# Patient Record
Sex: Female | Born: 1955 | Race: White | Hispanic: No | Marital: Married | Smoking: Current every day smoker
Health system: Southern US, Community
[De-identification: ages and names within clinical notes are randomized; demographics above are authoritative.]

## PROBLEM LIST (undated history)

## (undated) DIAGNOSIS — Z720 Tobacco use: Secondary | ICD-10-CM

## (undated) DIAGNOSIS — R55 Syncope and collapse: Secondary | ICD-10-CM

## (undated) DIAGNOSIS — E7800 Pure hypercholesterolemia, unspecified: Secondary | ICD-10-CM

## (undated) DIAGNOSIS — E782 Mixed hyperlipidemia: Secondary | ICD-10-CM

## (undated) DIAGNOSIS — T7840XA Allergy, unspecified, initial encounter: Secondary | ICD-10-CM

## (undated) DIAGNOSIS — E041 Nontoxic single thyroid nodule: Secondary | ICD-10-CM

## (undated) DIAGNOSIS — M1990 Unspecified osteoarthritis, unspecified site: Secondary | ICD-10-CM

## (undated) DIAGNOSIS — I10 Essential (primary) hypertension: Secondary | ICD-10-CM

## (undated) DIAGNOSIS — N390 Urinary tract infection, site not specified: Secondary | ICD-10-CM

## (undated) DIAGNOSIS — F32A Depression, unspecified: Secondary | ICD-10-CM

## (undated) DIAGNOSIS — E785 Hyperlipidemia, unspecified: Secondary | ICD-10-CM

## (undated) DIAGNOSIS — Z780 Asymptomatic menopausal state: Secondary | ICD-10-CM

## (undated) HISTORY — DX: Tobacco use: Z72.0

## (undated) HISTORY — DX: Nontoxic single thyroid nodule: E04.1

## (undated) HISTORY — DX: Mixed hyperlipidemia: E78.2

## (undated) HISTORY — DX: Pure hypercholesterolemia, unspecified: E78.00

## (undated) HISTORY — DX: Essential (primary) hypertension: I10

## (undated) HISTORY — DX: Syncope and collapse: R55

## (undated) HISTORY — DX: Unspecified osteoarthritis, unspecified site: M19.90

## (undated) HISTORY — DX: Asymptomatic menopausal state: Z78.0

## (undated) HISTORY — DX: Hyperlipidemia, unspecified: E78.5

## (undated) HISTORY — DX: Depression, unspecified: F32.A

## (undated) HISTORY — DX: Urinary tract infection, site not specified: N39.0

## (undated) HISTORY — DX: Allergy, unspecified, initial encounter: T78.40XA

---

## 1998-09-05 ENCOUNTER — Other Ambulatory Visit: Admission: RE | Admit: 1998-09-05 | Discharge: 1998-09-05 | Payer: Self-pay | Admitting: Gynecology

## 2000-09-01 ENCOUNTER — Other Ambulatory Visit: Admission: RE | Admit: 2000-09-01 | Discharge: 2000-09-01 | Payer: Self-pay | Admitting: Gynecology

## 2004-06-07 ENCOUNTER — Other Ambulatory Visit: Admission: RE | Admit: 2004-06-07 | Discharge: 2004-06-07 | Payer: Self-pay | Admitting: Gynecology

## 2005-06-18 ENCOUNTER — Other Ambulatory Visit: Admission: RE | Admit: 2005-06-18 | Discharge: 2005-06-18 | Payer: Self-pay | Admitting: Gynecology

## 2005-07-16 ENCOUNTER — Ambulatory Visit (HOSPITAL_COMMUNITY): Admission: RE | Admit: 2005-07-16 | Discharge: 2005-07-16 | Payer: Self-pay | Admitting: Gynecology

## 2006-07-08 ENCOUNTER — Other Ambulatory Visit: Admission: RE | Admit: 2006-07-08 | Discharge: 2006-07-08 | Payer: Self-pay | Admitting: Gynecology

## 2006-07-10 ENCOUNTER — Ambulatory Visit (HOSPITAL_COMMUNITY): Admission: RE | Admit: 2006-07-10 | Discharge: 2006-07-10 | Payer: Self-pay | Admitting: Gynecology

## 2006-07-13 ENCOUNTER — Emergency Department (HOSPITAL_COMMUNITY): Admission: EM | Admit: 2006-07-13 | Discharge: 2006-07-13 | Payer: Self-pay | Admitting: Emergency Medicine

## 2008-02-29 ENCOUNTER — Encounter: Admission: RE | Admit: 2008-02-29 | Discharge: 2008-02-29 | Payer: Self-pay | Admitting: Endocrinology

## 2008-12-08 LAB — HM COLONOSCOPY

## 2009-02-15 ENCOUNTER — Encounter: Admission: RE | Admit: 2009-02-15 | Discharge: 2009-02-15 | Payer: Self-pay | Admitting: Endocrinology

## 2010-03-14 ENCOUNTER — Encounter
Admission: RE | Admit: 2010-03-14 | Discharge: 2010-03-14 | Payer: Self-pay | Source: Home / Self Care | Attending: Endocrinology | Admitting: Endocrinology

## 2010-03-27 ENCOUNTER — Encounter
Admission: RE | Admit: 2010-03-27 | Discharge: 2010-03-27 | Payer: Self-pay | Source: Home / Self Care | Attending: Endocrinology | Admitting: Endocrinology

## 2010-04-08 ENCOUNTER — Encounter: Payer: Self-pay | Admitting: Endocrinology

## 2010-04-08 ENCOUNTER — Encounter: Payer: Self-pay | Admitting: Gynecology

## 2011-11-26 ENCOUNTER — Other Ambulatory Visit: Payer: Self-pay | Admitting: Endocrinology

## 2011-11-26 DIAGNOSIS — E041 Nontoxic single thyroid nodule: Secondary | ICD-10-CM

## 2011-12-11 ENCOUNTER — Ambulatory Visit
Admission: RE | Admit: 2011-12-11 | Discharge: 2011-12-11 | Disposition: A | Discharge status: Home / Self Care | Payer: BC Managed Care – PPO | Source: Ambulatory Visit | Attending: Endocrinology | Admitting: Endocrinology

## 2011-12-11 DIAGNOSIS — E041 Nontoxic single thyroid nodule: Secondary | ICD-10-CM

## 2012-12-15 IMAGING — US US SOFT TISSUE HEAD/NECK
1 series · 14 of 25 positions shown · non-contrast
Comparison: 03/14/2010

CLINICAL DATA: Thyroid nodules

THYROID ULTRASOUND
TECHNIQUE: Ultrasound examination of the thyroid gland and adjacent
soft tissues was performed.

[Series 1: us soft tissue head/neck · 0.08mm/px · 14 of 45 slices shown]
[im 1/45]
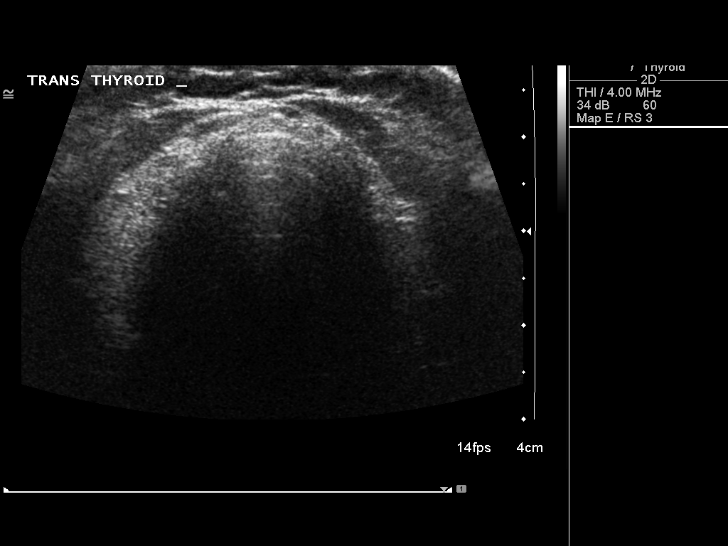
[im 4/45]
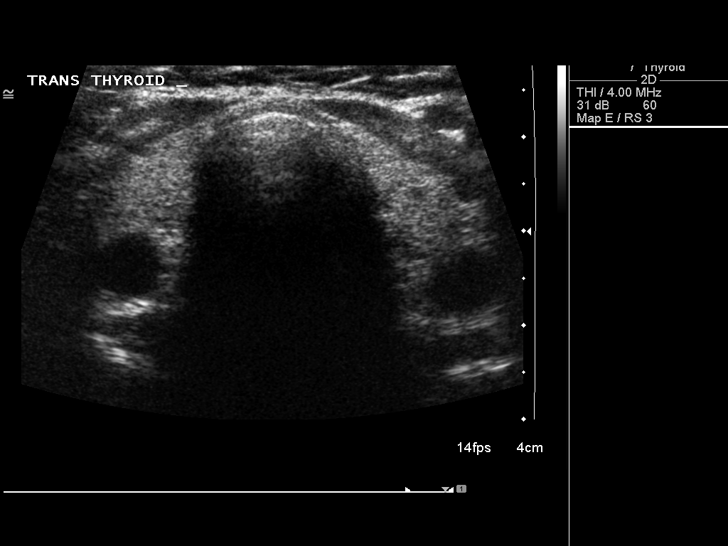
[im 8/45]
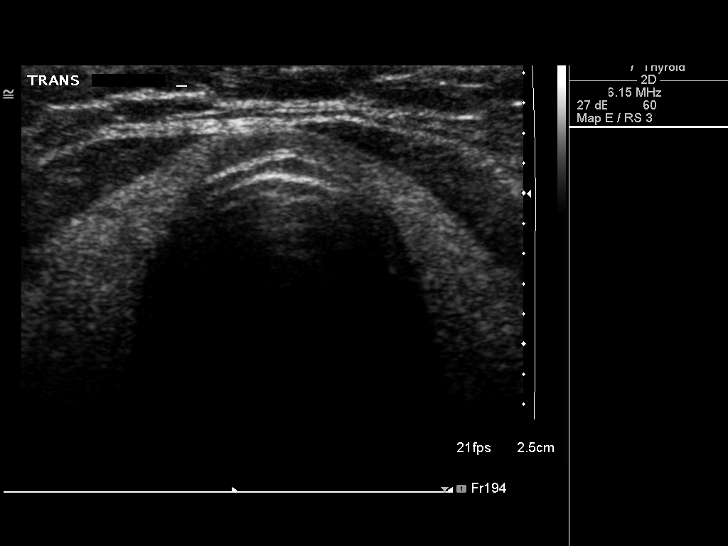
[im 12/45]
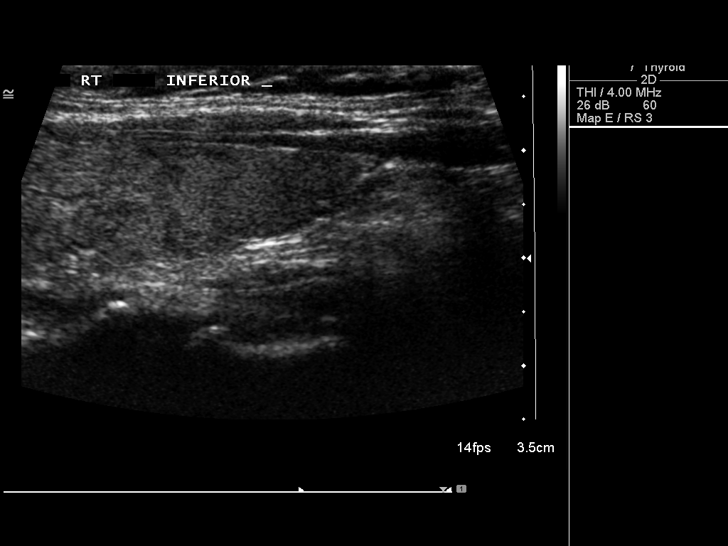
[im 15/45]
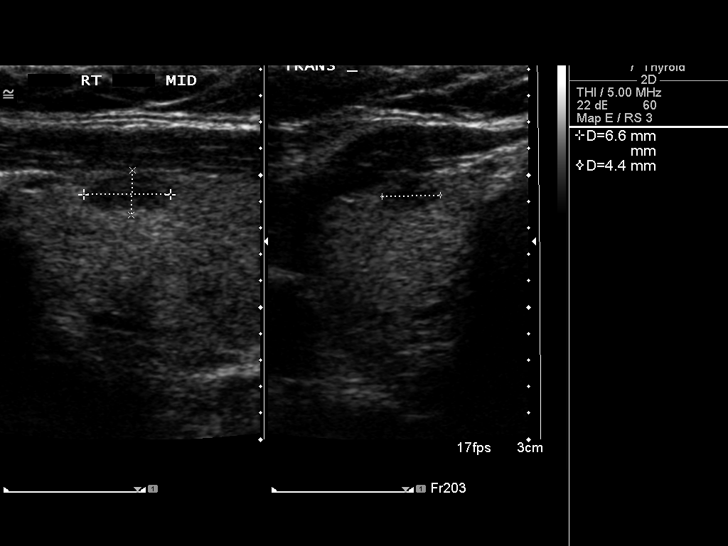
[im 17/45]
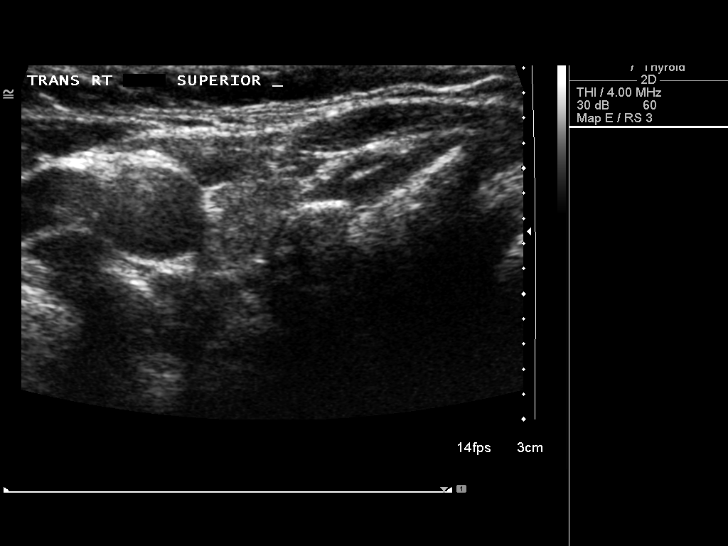
[im 21/45]
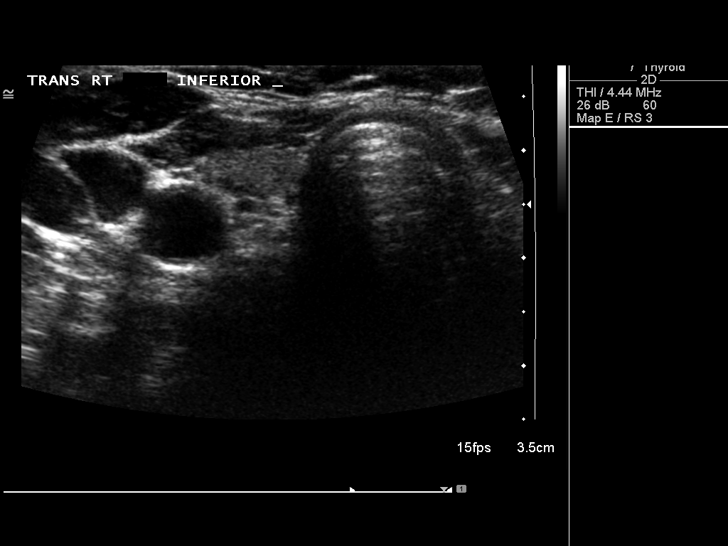
[im 24/45]
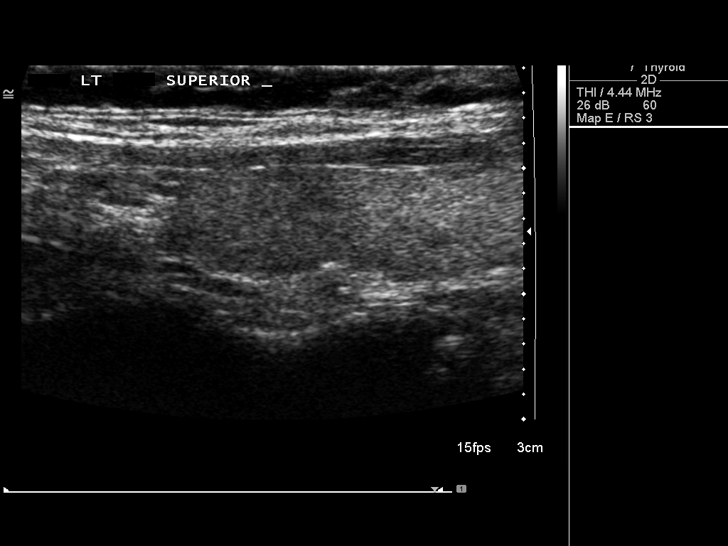
[im 28/45]
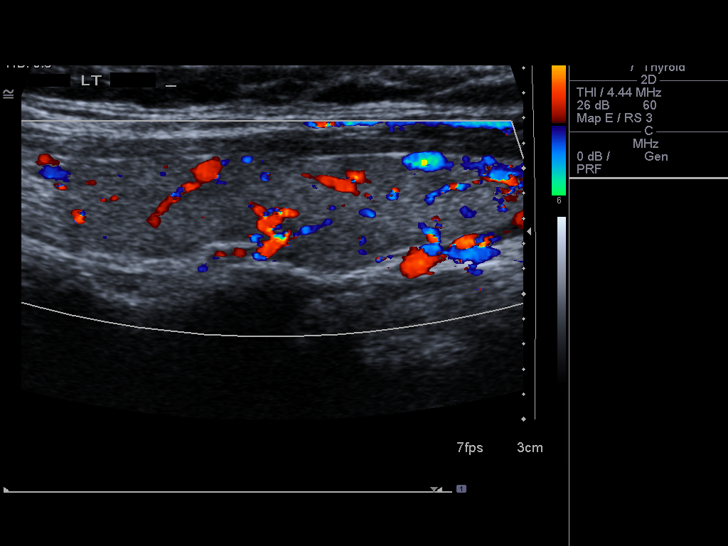
[im 30/45]
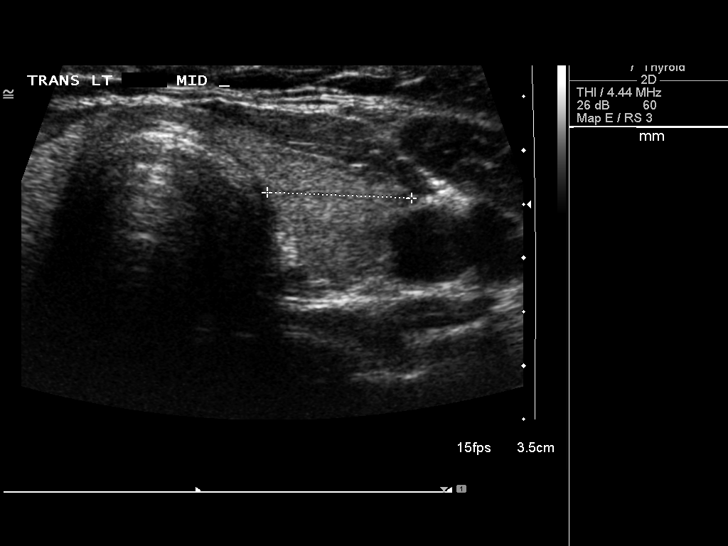
[im 34/45]
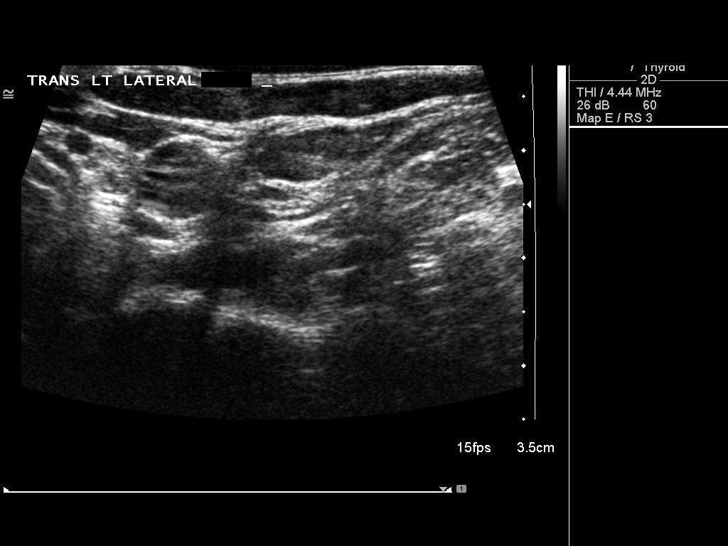
[im 37/45]
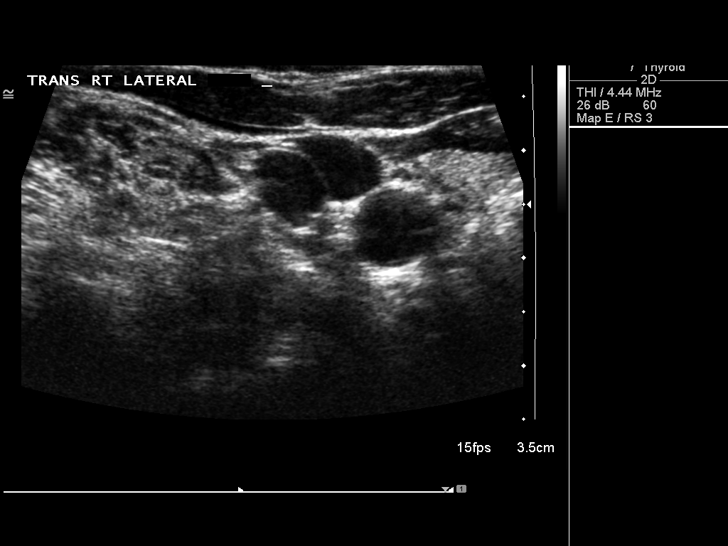
[im 41/45]
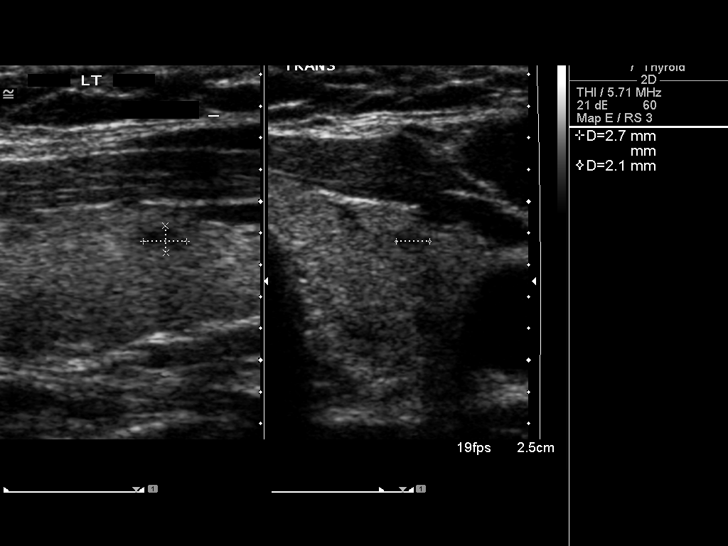
[im 45/45]
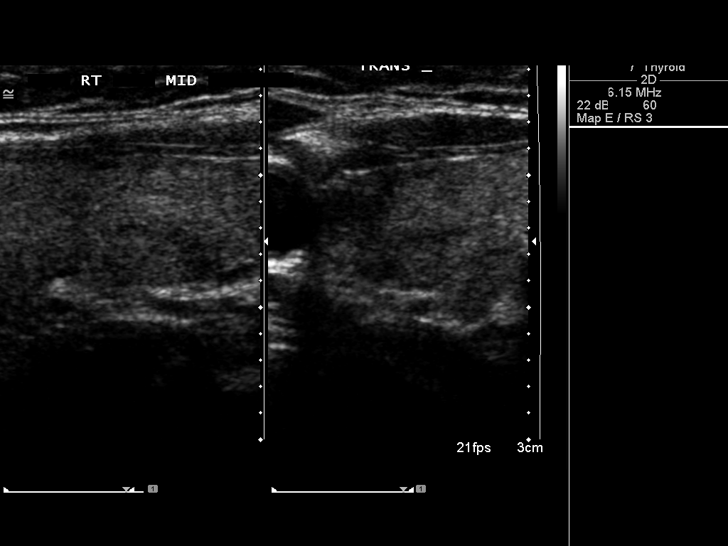

[14 of 25 positions shown; findings below may reference images not displayed]

FINDINGS: Right thyroid lobe:  Measures 5.0 x 1.3 x 1.7 cm with faintly
heterogeneous echotexture.
Left thyroid lobe:  Measures 4.6 x 0.8 x 1.3 cm with faintly
heterogeneous echotexture.
Isthmus:  Measures 0.2 cm

Focal nodules:
1.  0.6 x 0.3 x 0.4 cm solid nodule in the right lobe upper pole
(stable).
2.  0.5 x 0.4 x 0.5 cm solid nodule in the right lobe of the lower
pole (stable).
3.  Less than 3 mm nodule in the left lobe lower pole (stable).

Lymphadenopathy:  None visualized.
IMPRESSION: 1.  Stable appearance of the thyroid gland, with several small
solid nodules well below 1 cm.  No significant change from 0522.
No specific imaging follow-up is necessary unless the patient has
special risk factors for thyroid cancer or experiences nodule
enlargement.

## 2013-04-26 ENCOUNTER — Other Ambulatory Visit: Payer: Self-pay | Admitting: Endocrinology

## 2013-04-26 DIAGNOSIS — E041 Nontoxic single thyroid nodule: Secondary | ICD-10-CM

## 2013-11-29 ENCOUNTER — Ambulatory Visit
Admission: RE | Admit: 2013-11-29 | Discharge: 2013-11-29 | Disposition: A | Discharge status: Home / Self Care | Payer: BC Managed Care – PPO | Source: Ambulatory Visit | Attending: Endocrinology | Admitting: Endocrinology

## 2013-11-29 ENCOUNTER — Encounter (INDEPENDENT_AMBULATORY_CARE_PROVIDER_SITE_OTHER): Payer: Self-pay

## 2013-11-29 DIAGNOSIS — E041 Nontoxic single thyroid nodule: Secondary | ICD-10-CM

## 2013-12-22 ENCOUNTER — Other Ambulatory Visit: Payer: Self-pay | Admitting: Endocrinology

## 2013-12-22 DIAGNOSIS — E041 Nontoxic single thyroid nodule: Secondary | ICD-10-CM

## 2014-12-19 ENCOUNTER — Ambulatory Visit
Admission: RE | Admit: 2014-12-19 | Discharge: 2014-12-19 | Disposition: A | Discharge status: Home / Self Care | Payer: 59 | Source: Ambulatory Visit | Attending: Endocrinology | Admitting: Endocrinology

## 2014-12-19 DIAGNOSIS — E041 Nontoxic single thyroid nodule: Secondary | ICD-10-CM

## 2014-12-22 ENCOUNTER — Other Ambulatory Visit: Payer: Self-pay | Admitting: Endocrinology

## 2014-12-22 DIAGNOSIS — E041 Nontoxic single thyroid nodule: Secondary | ICD-10-CM

## 2015-01-10 ENCOUNTER — Other Ambulatory Visit (HOSPITAL_COMMUNITY)
Admission: RE | Admit: 2015-01-10 | Discharge: 2015-01-10 | Disposition: A | Discharge status: Home / Self Care | Payer: 59 | Source: Ambulatory Visit | Attending: Physician Assistant | Admitting: Physician Assistant

## 2015-01-10 ENCOUNTER — Ambulatory Visit
Admission: RE | Admit: 2015-01-10 | Discharge: 2015-01-10 | Disposition: A | Discharge status: Home / Self Care | Payer: 59 | Source: Ambulatory Visit | Attending: Endocrinology | Admitting: Endocrinology

## 2015-01-10 DIAGNOSIS — E041 Nontoxic single thyroid nodule: Secondary | ICD-10-CM | POA: Diagnosis not present

## 2015-01-10 NOTE — Procedures (Signed)
Using direct ultrasound guidance, 4 passes were made using needles into the nodule within the left lobe of the thyroid.   Ultrasound was used to confirm needle placements on all occasions.   Specimens were sent to Pathology for analysis.   WENDY S BLAIR PA-C 01/10/2015 1:43 PM   

## 2015-05-17 LAB — BASIC METABOLIC PANEL
BUN: 14 (ref 4–21)
Creatinine: 0.8 (ref 0.5–1.1)
Glucose: 95
Potassium: 4.4 (ref 3.4–5.3)
SODIUM: 140 (ref 137–147)

## 2015-05-17 LAB — CBC AND DIFFERENTIAL
HEMATOCRIT: 43 (ref 36–46)
HEMOGLOBIN: 13.6 (ref 12.0–16.0)
PLATELETS: 193 (ref 150–399)
WBC: 4.2

## 2015-05-17 LAB — LIPID PANEL
CHOLESTEROL: 178 (ref 0–200)
HDL: 52 (ref 35–70)
LDL CALC: 112
TRIGLYCERIDES: 69 (ref 40–160)

## 2015-05-17 LAB — HEPATIC FUNCTION PANEL
ALK PHOS: 65 (ref 25–125)
ALT: 28 (ref 7–35)
AST: 17 (ref 13–35)
Bilirubin, Total: 0.2

## 2015-05-17 LAB — VITAMIN D 25 HYDROXY (VIT D DEFICIENCY, FRACTURES): Vit D, 25-Hydroxy: 37.4

## 2015-05-17 LAB — TSH: TSH: 2.3 (ref 0.41–5.90)

## 2015-06-21 ENCOUNTER — Other Ambulatory Visit: Payer: Self-pay | Admitting: Endocrinology

## 2015-06-21 DIAGNOSIS — E041 Nontoxic single thyroid nodule: Secondary | ICD-10-CM

## 2015-12-11 LAB — HEPATIC FUNCTION PANEL
ALT: 20 (ref 7–35)
AST: 13 (ref 13–35)
Alkaline Phosphatase: 66 (ref 25–125)
Bilirubin, Total: 0.4

## 2015-12-11 LAB — BASIC METABOLIC PANEL
BUN: 14 (ref 4–21)
CREATININE: 0.8 (ref 0.5–1.1)
Glucose: 90
POTASSIUM: 4.5 (ref 3.4–5.3)
SODIUM: 139 (ref 137–147)

## 2015-12-20 ENCOUNTER — Ambulatory Visit
Admission: RE | Admit: 2015-12-20 | Discharge: 2015-12-20 | Disposition: A | Discharge status: Home / Self Care | Payer: BLUE CROSS/BLUE SHIELD | Source: Ambulatory Visit | Attending: Endocrinology | Admitting: Endocrinology

## 2015-12-20 DIAGNOSIS — E041 Nontoxic single thyroid nodule: Secondary | ICD-10-CM

## 2015-12-24 IMAGING — US US SOFT TISSUE HEAD/NECK
1 series · 14 of 25 positions shown · non-contrast
Comparison: 11/29/2013, 12/11/2011, 03/14/2010, 02/15/2009,
02/29/2008

CLINICAL DATA: 59-year-old female with a history of thyroid nodules

EXAM:
THYROID ULTRASOUND
TECHNIQUE: Ultrasound examination of the thyroid gland and adjacent soft
tissues was performed.

[Series 1: us soft tissue head/neck · 0.06mm/px · 14 of 51 slices shown]
[im 1/51]
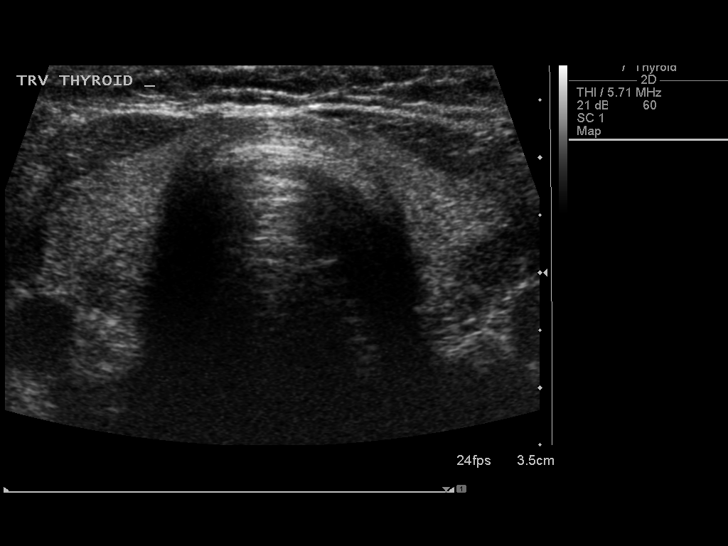
[im 5/51]
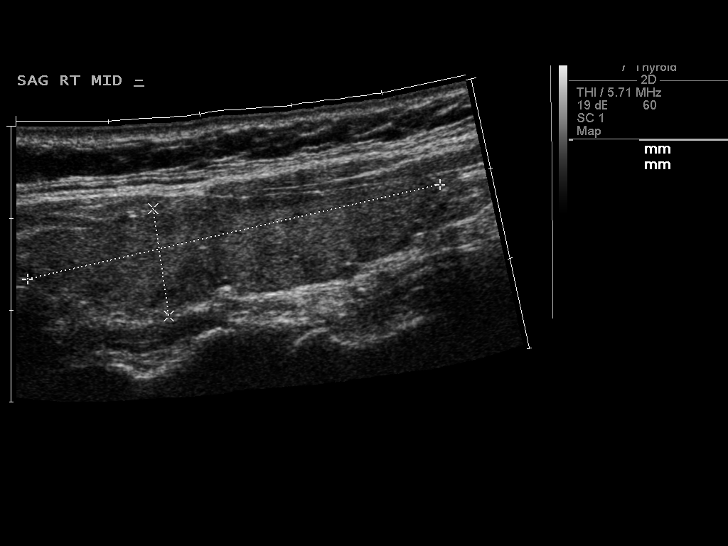
[im 9/51]
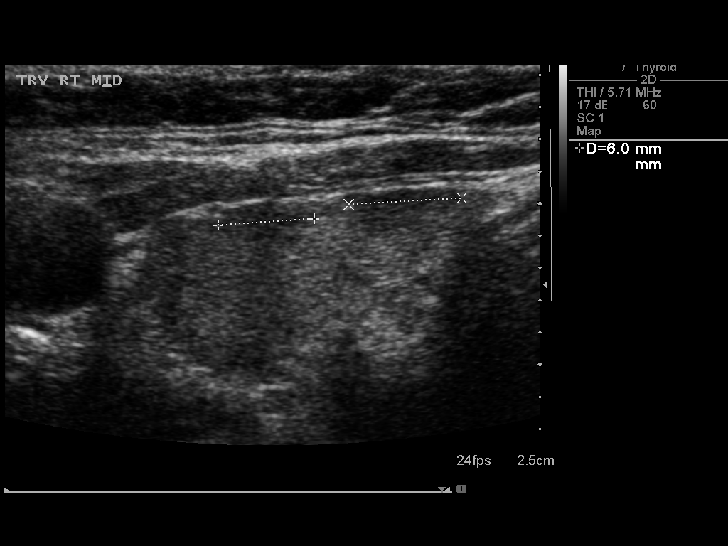
[im 13/51]
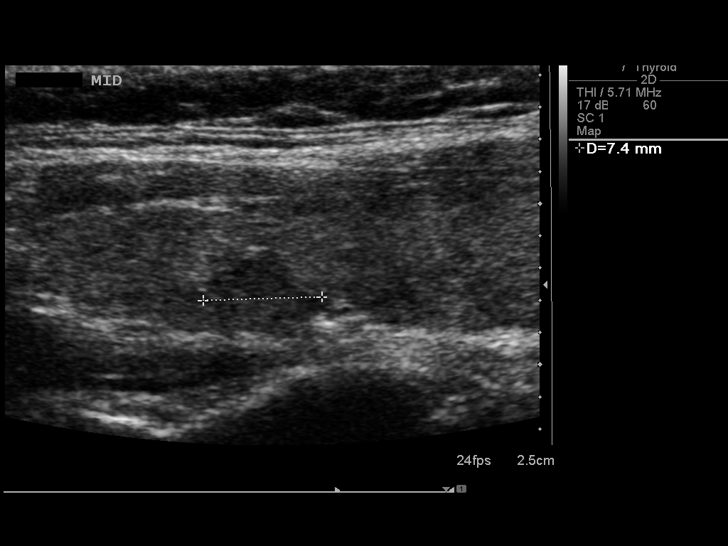
[im 17/51]
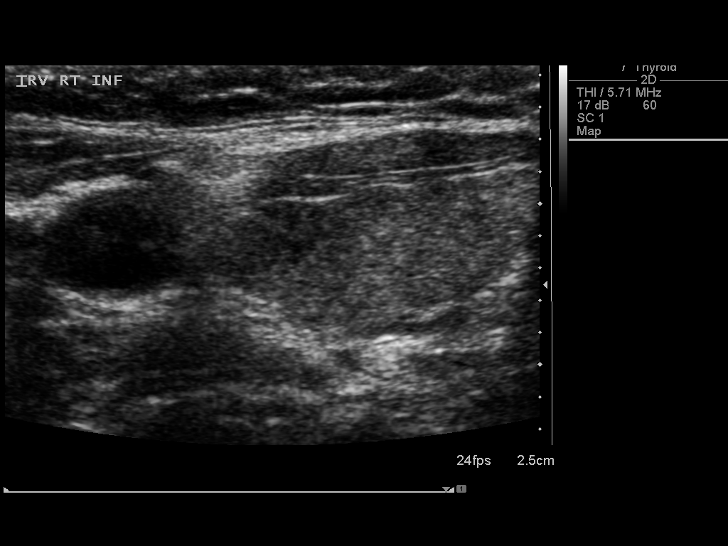
[im 19/51]
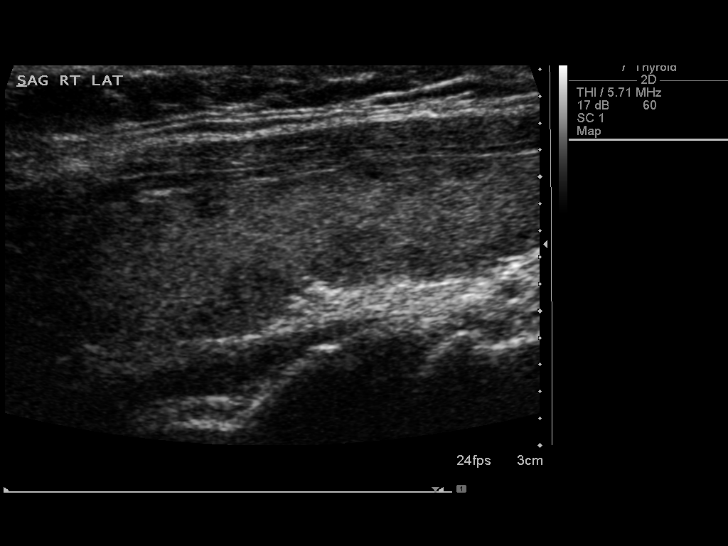
[im 23/51]
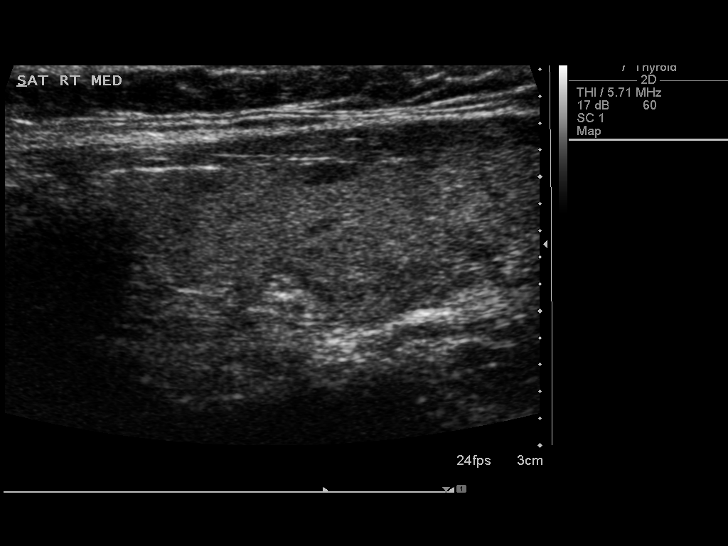
[im 28/51]
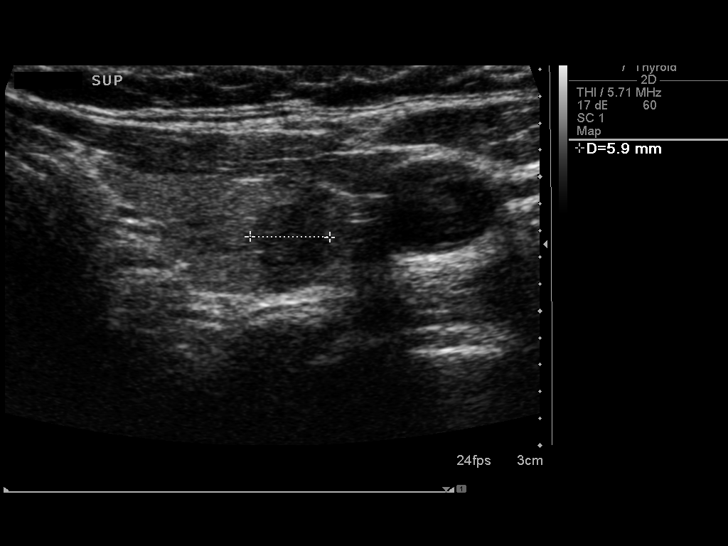
[im 32/51]
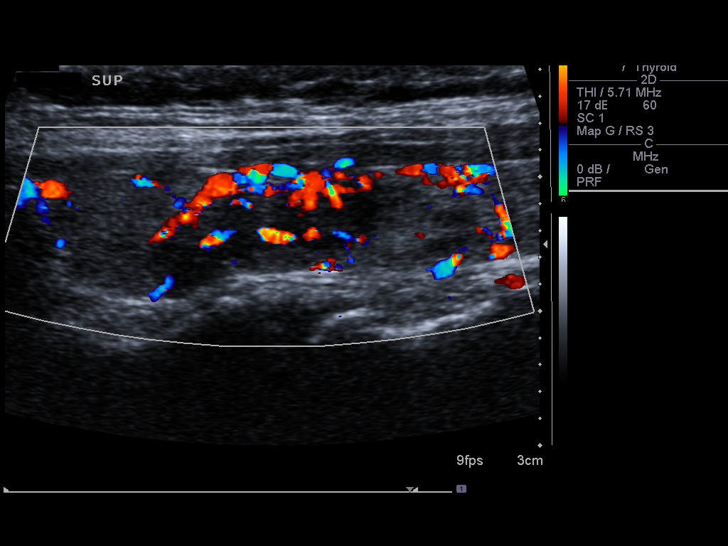
[im 34/51]
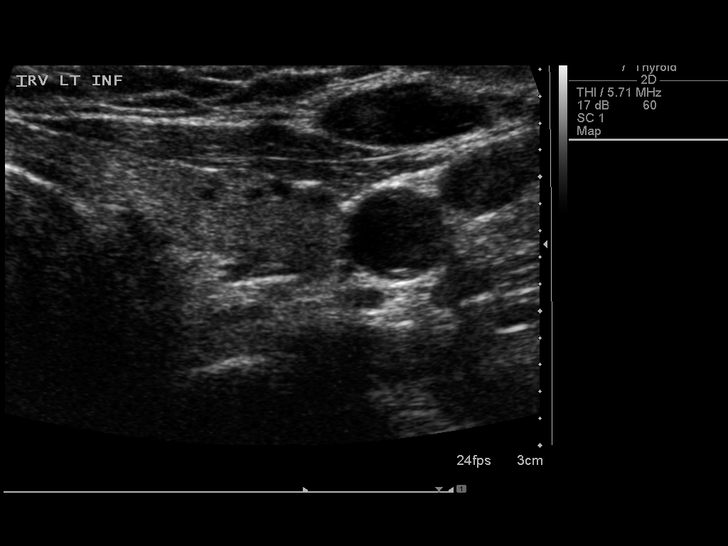
[im 38/51]
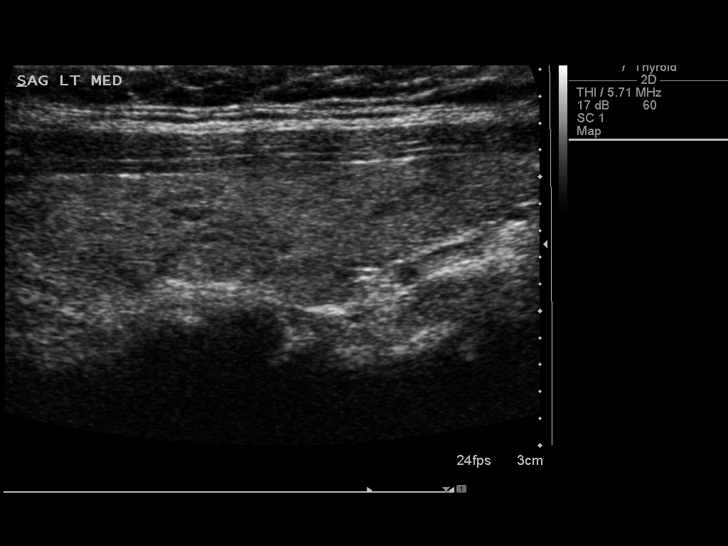
[im 42/51]
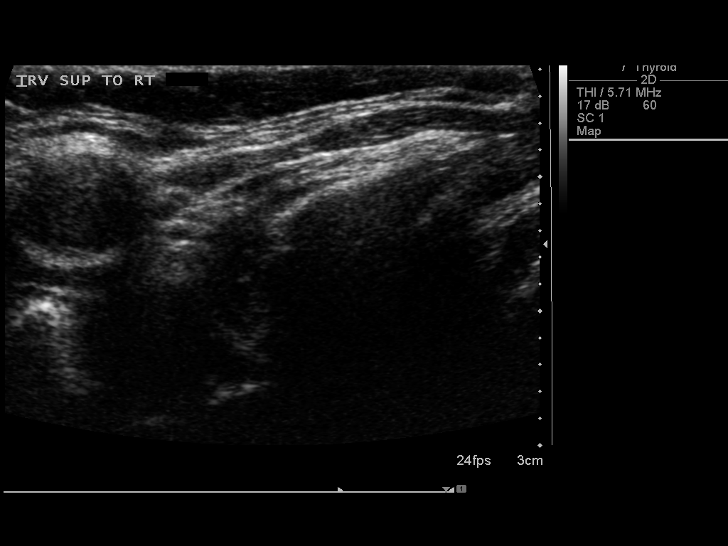
[im 46/51]
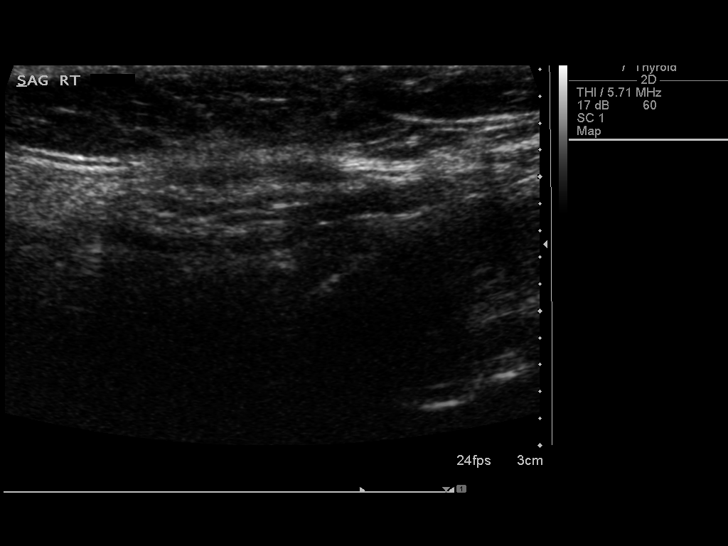
[im 51/51]
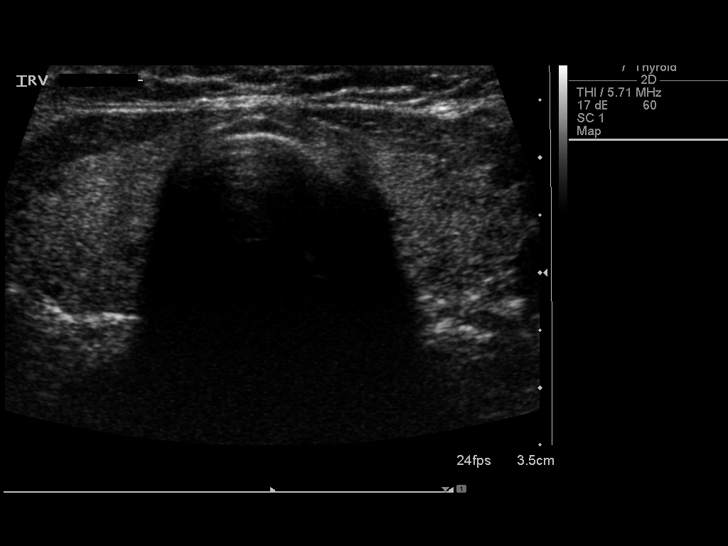

[14 of 25 positions shown; findings below may reference images not displayed]

FINDINGS: Right thyroid lobe

Measurements: 4.6 cm x 1.2 cm x 1.7 cm. Multiple nodules of the
right thyroid again noted.

None of these measure greater than 8 mm, which is the dominant
nodule inferiorly.

Left thyroid lobe

Measurements: 4.0 cm x 1.1 cm x 1.4 cm. Dominant nodule on the left
measures 1.9 cm x 7 mm x 6 mm, which has grown significantly as
compared to the prior. (Previous: 1.0 cm x 6 mm x 7 mm).

Isthmus

Thickness: 3 mm.  No nodules visualized.

Lymphadenopathy

None visualized.
IMPRESSION: Multinodular thyroid again noted. Left thyroid nodule meets criteria
for biopsy.

Ultrasound-guided fine needle aspiration should be considered, as
per the consensus statement: Management of Thyroid Nodules Detected
at US: Society of Radiologists in Ultrasound Consensus Conference

## 2016-01-15 IMAGING — US US THYROID BIOPSY
1 series · 13 of 23 positions shown · non-contrast
Comparison: Ultrasound done 12/19/2014.

CLINICAL DATA: Left thyroid nodule which measures 1.9 cm x 7 mm x 6
mm, which has grown significantly as compared to the prior.
(Previous: 1.0 cm x 6 mm x 7 mm). Request for ultrasound guided fine
needle aspirate biopsy

EXAM:
ULTRASOUND GUIDED NEEDLE ASPIRATE BIOPSY OF THE THYROID GLAND

[Series 1: us thyroid biopsy · 0.06mm/px · 23 acquisitions, 13 frames shown]
[im 1/23]
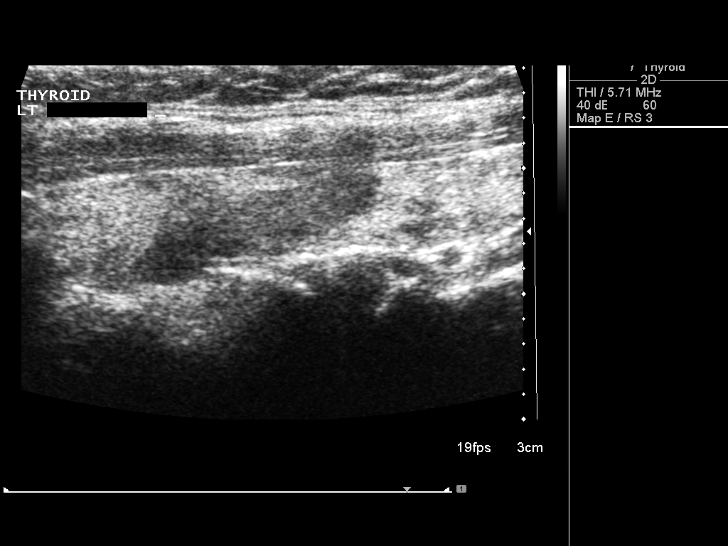
[im 3/23]
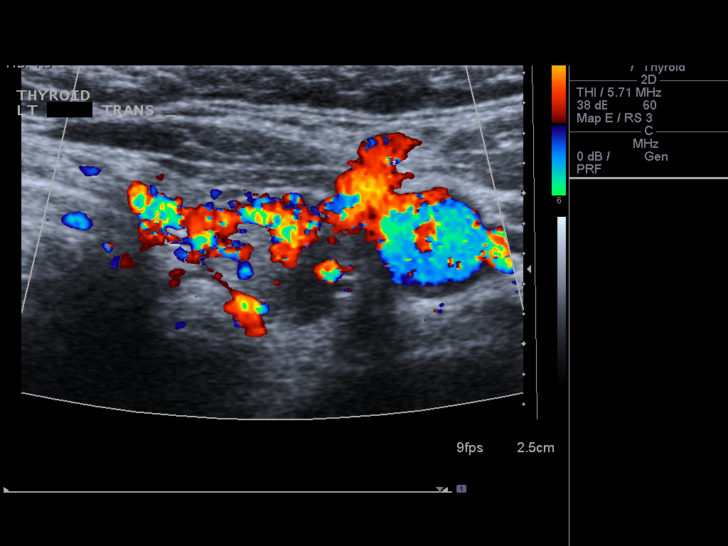
[im 5/23]
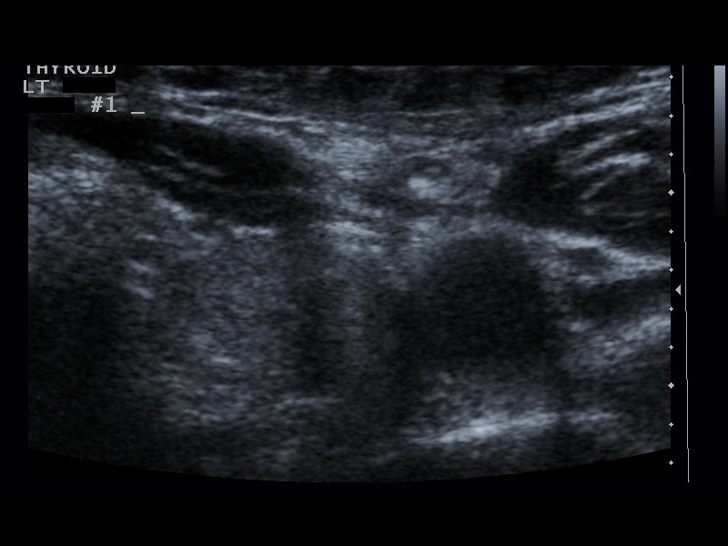
[im 7/23]
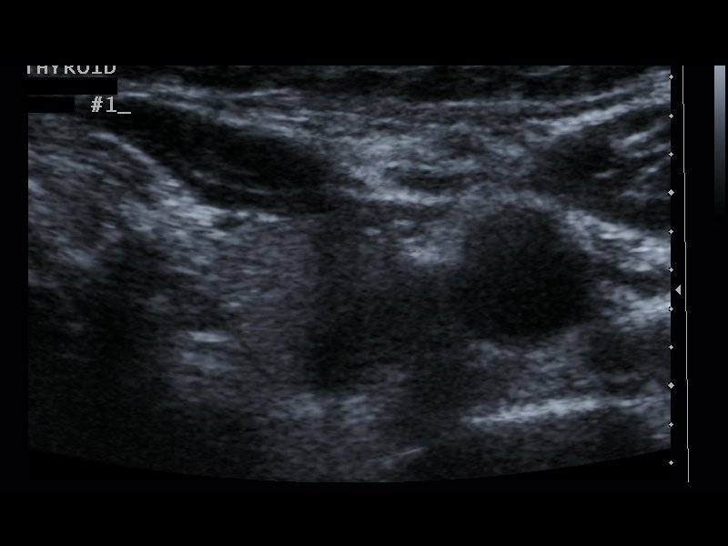
[im 8/23]
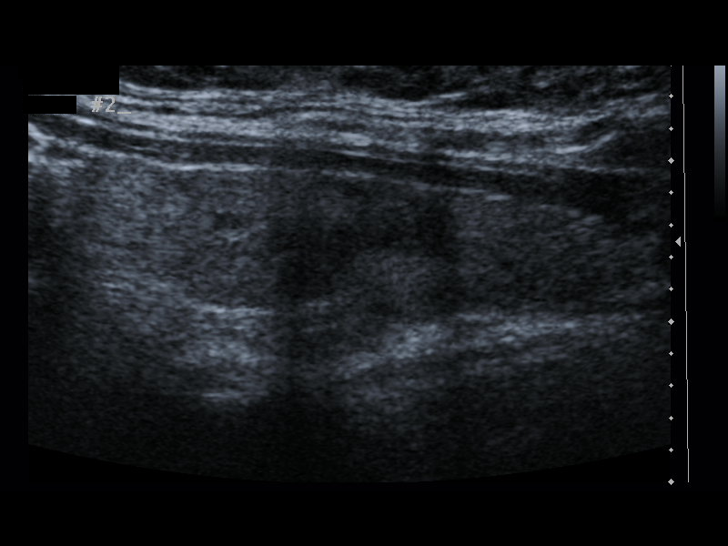
[im 10/23]
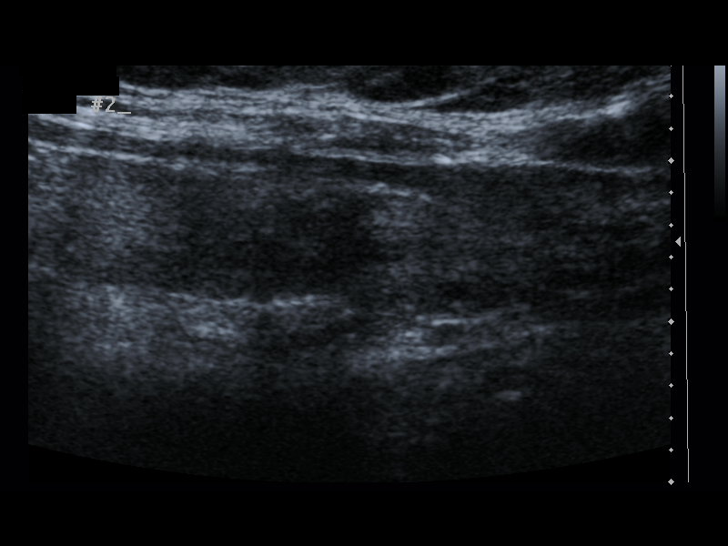
[im 12/23]
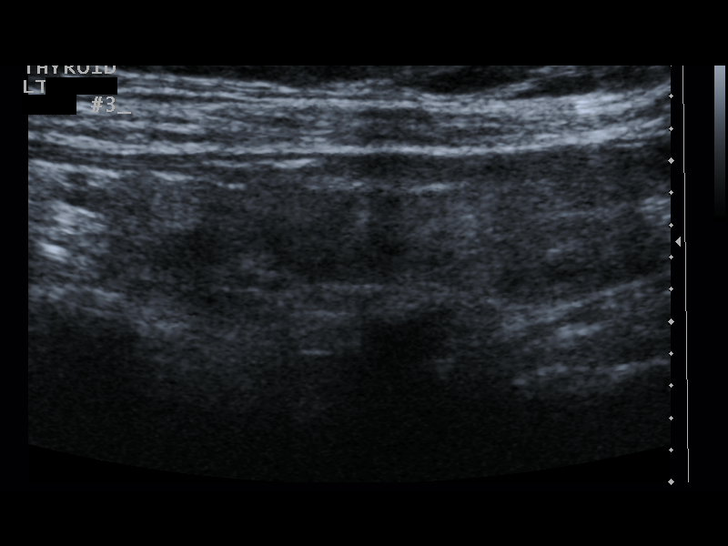
[im 14/23]
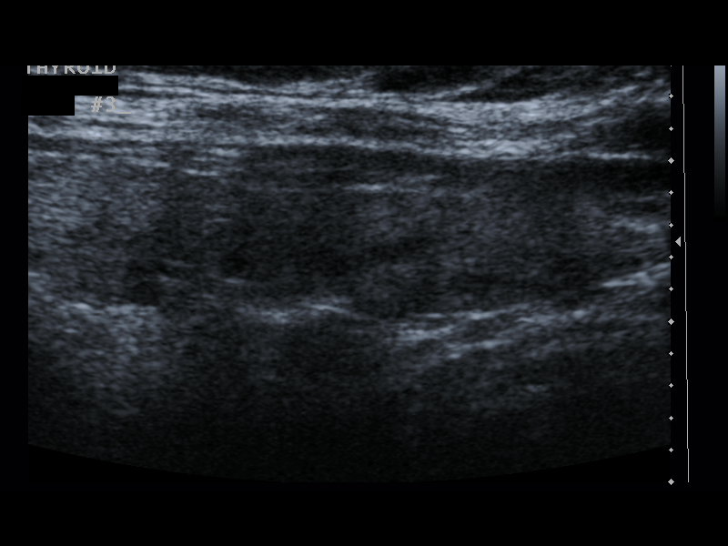
[im 16/23]
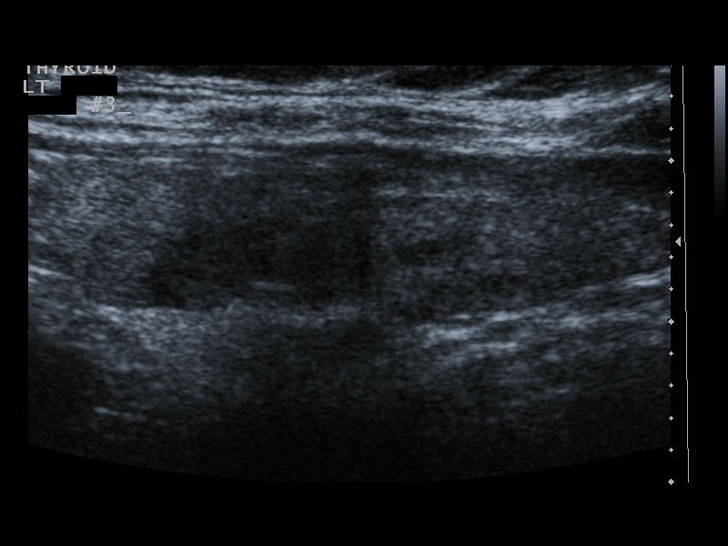
[im 17/23]
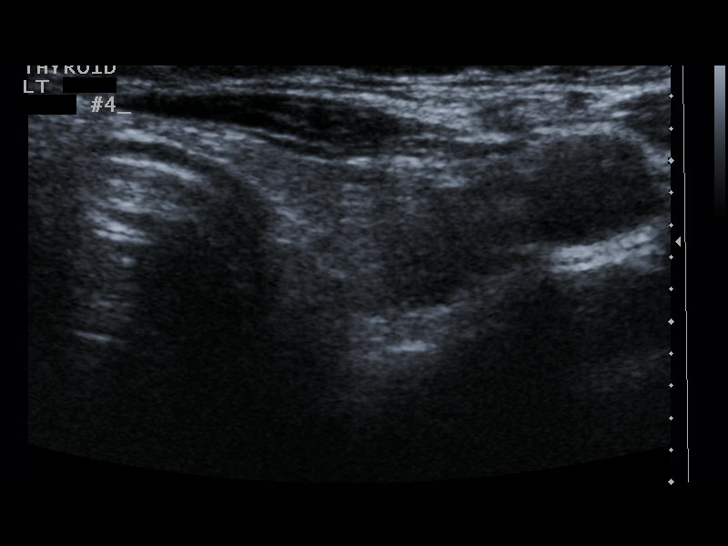
[im 19/23]
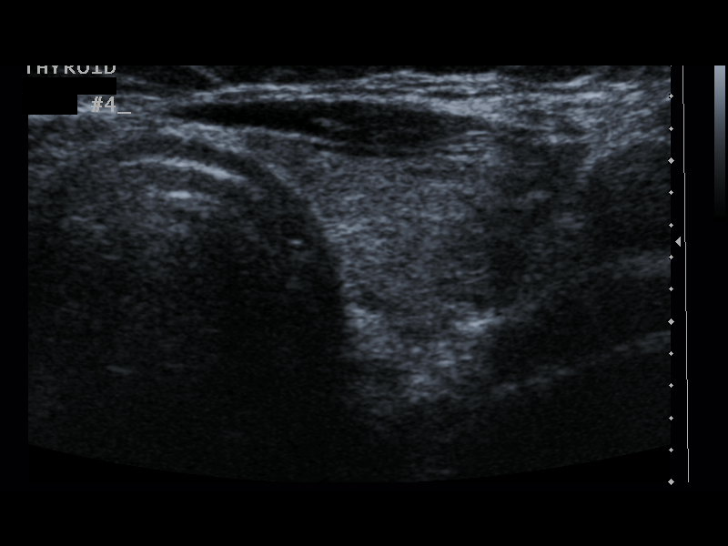
[im 21/23]
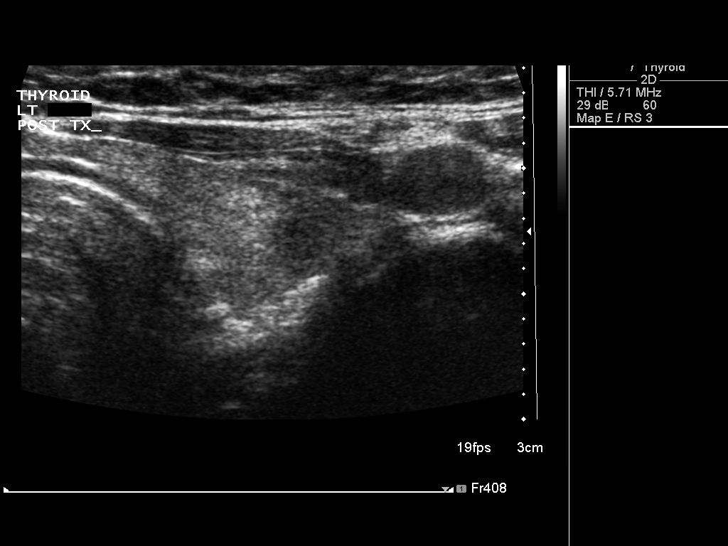
[im 23/23]
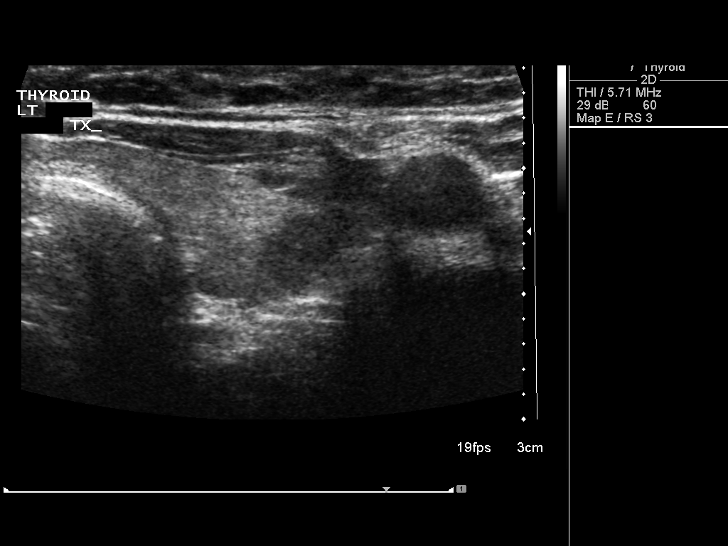

[13 of 23 positions shown; findings below may reference images not displayed]

PROCEDURE:
Thyroid biopsy was thoroughly discussed with the patient and
questions were answered. The benefits, risks, alternatives, and
complications were also discussed. The patient understands and
wishes to proceed with the procedure. Written consent was obtained.



Complications:  None immediate
FINDINGS: Imaging confirms needle placement into the left thyroid nodule.
IMPRESSION: Ultrasound guided needle aspirate biopsy performed of the left
thyroid nodule.

## 2016-06-06 LAB — BASIC METABOLIC PANEL
BUN: 15 (ref 4–21)
CREATININE: 0.8 (ref 0.5–1.1)
Glucose: 92
Potassium: 4.1 (ref 3.4–5.3)
SODIUM: 142 (ref 137–147)

## 2016-06-06 LAB — CBC AND DIFFERENTIAL
HCT: 44 (ref 36–46)
HEMOGLOBIN: 14.6 (ref 12.0–16.0)
Platelets: 188 (ref 150–399)
WBC: 4.8

## 2016-06-06 LAB — LIPID PANEL
CHOLESTEROL: 221 — AB (ref 0–200)
HDL: 58 (ref 35–70)
LDL Cholesterol: 143
TRIGLYCERIDES: 100 (ref 40–160)

## 2016-06-06 LAB — HEPATIC FUNCTION PANEL
ALK PHOS: 68 (ref 25–125)
ALT: 25 (ref 7–35)
AST: 14 (ref 13–35)
BILIRUBIN, TOTAL: 0.4

## 2016-06-06 LAB — TSH: TSH: 3.73 (ref 0.41–5.90)

## 2016-06-06 LAB — VITAMIN D 25 HYDROXY (VIT D DEFICIENCY, FRACTURES): Vit D, 25-Hydroxy: 36

## 2016-12-24 IMAGING — US US THYROID
1 series · 13 of 25 positions shown · non-contrast
Comparison: 12/19/2014

CLINICAL DATA: Thyroid nodule follow-up

EXAM:
THYROID ULTRASOUND
TECHNIQUE: Ultrasound examination of the thyroid gland and adjacent soft
tissues was performed.

[Series 1: us thyroid · 0.06mm/px · 13 of 47 slices shown]
[im 1/47]
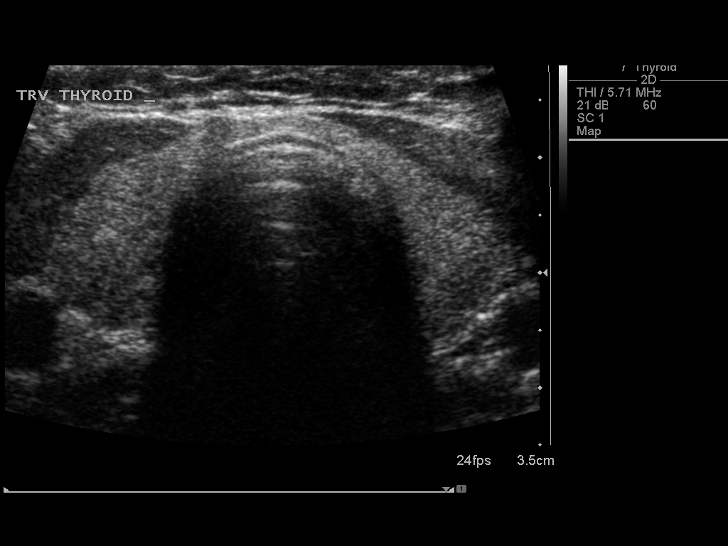
[im 4/47]
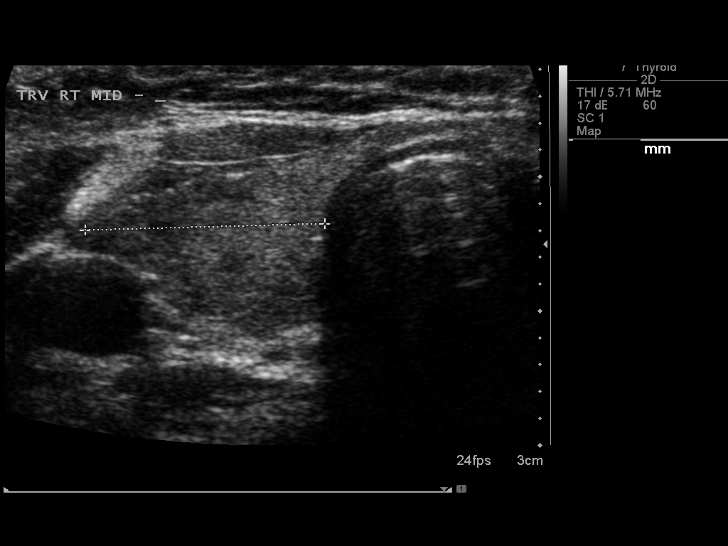
[im 8/47]
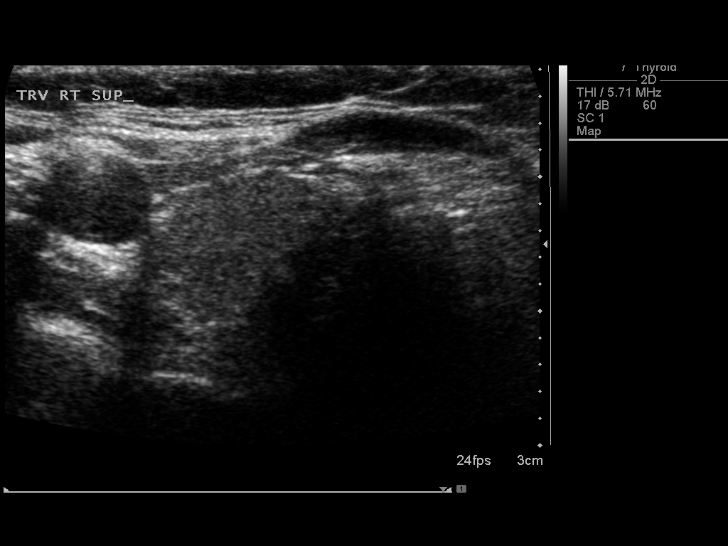
[im 12/47]
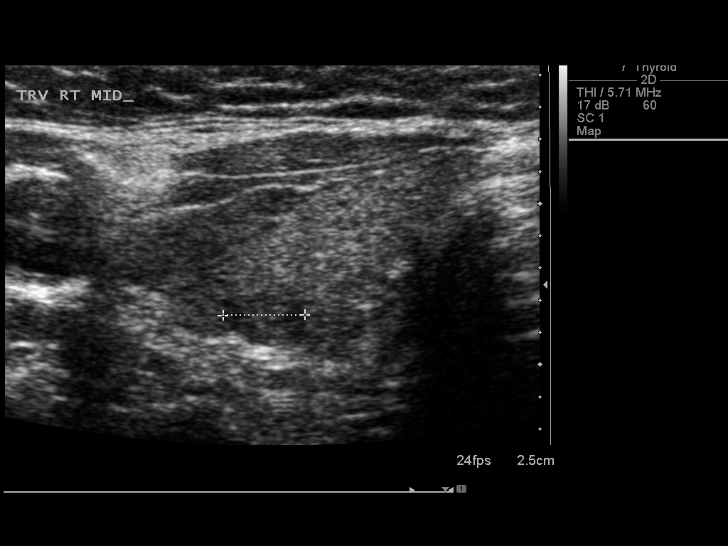
[im 16/47]
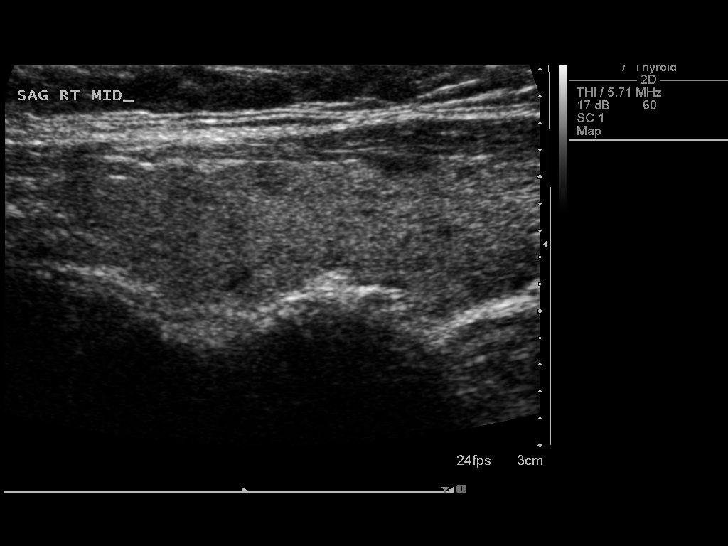
[im 20/47]
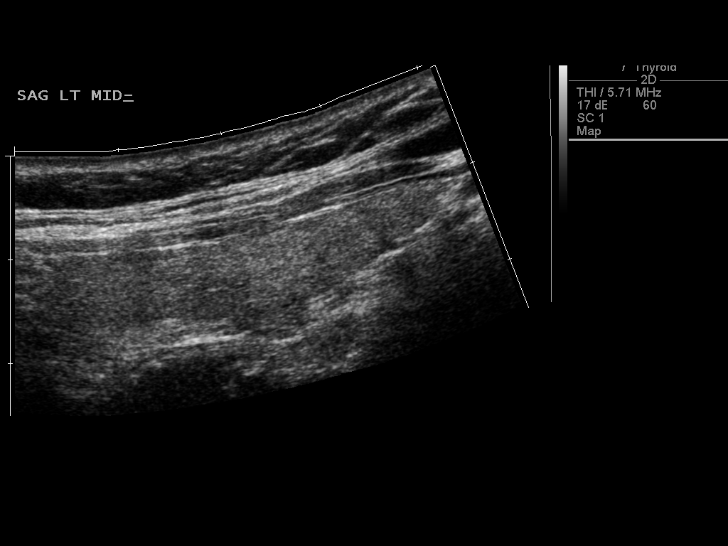
[im 24/47]
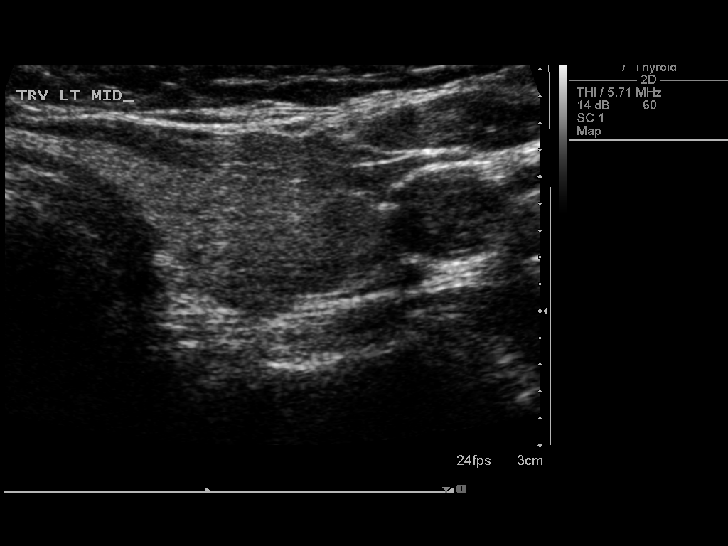
[im 27/47]
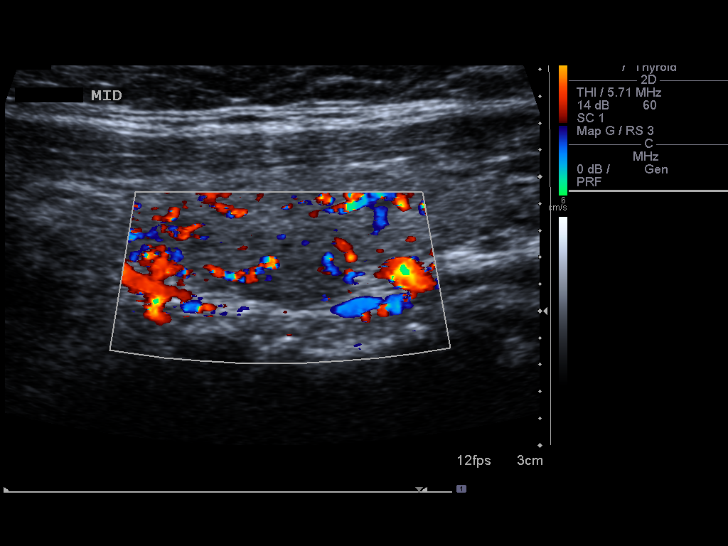
[im 31/47]
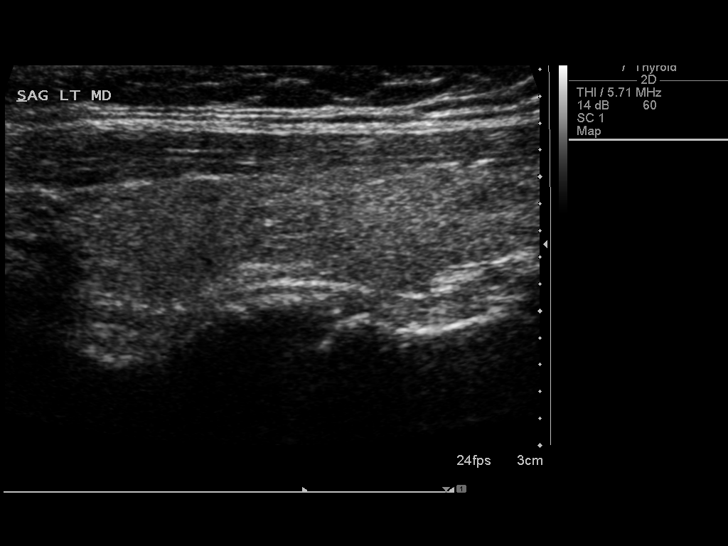
[im 35/47]
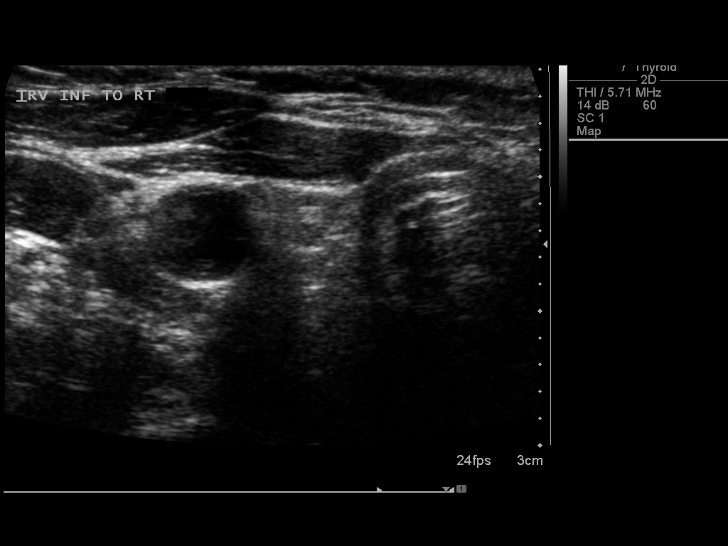
[im 39/47]
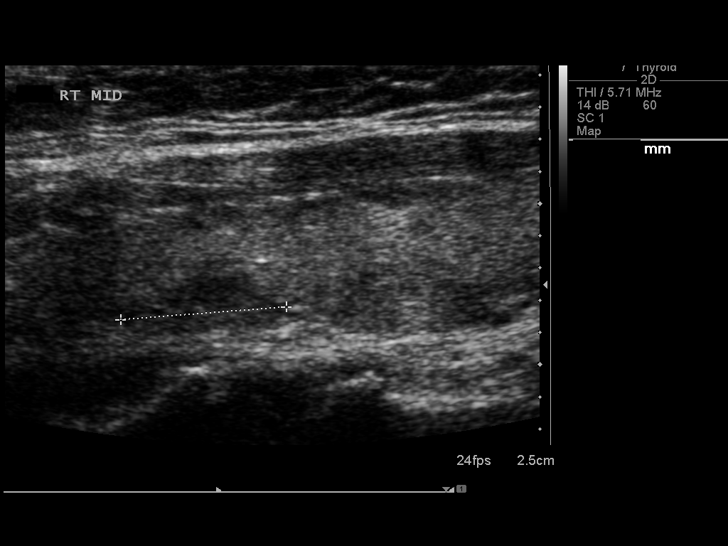
[im 43/47]
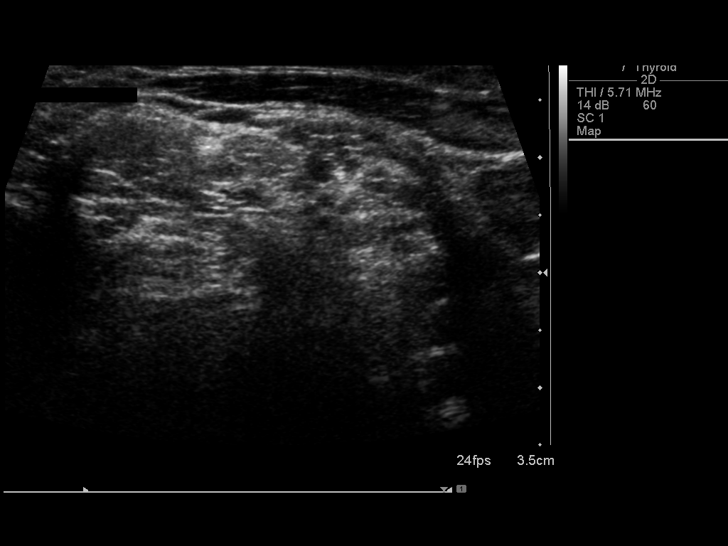
[im 47/47]
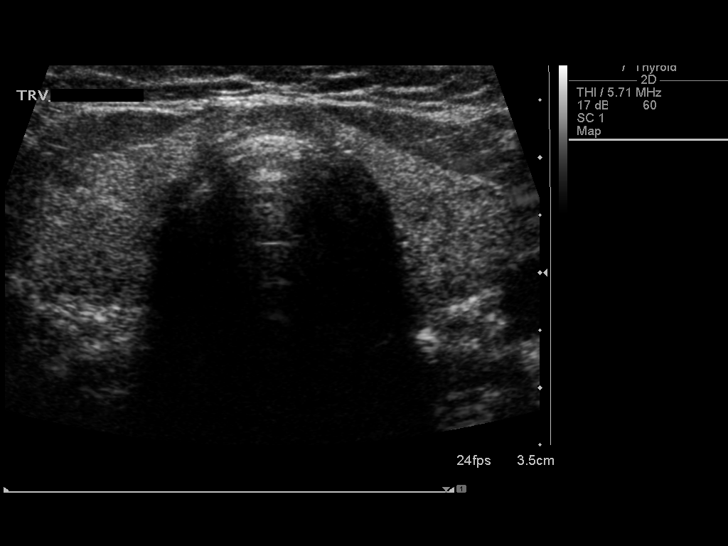

[13 of 25 positions shown; findings below may reference images not displayed]

FINDINGS: Parenchymal Echotexture: Normal

Estimated total number of nodules ? 1 cm: 1

Number of spongiform nodules ? 2 cm not described below (TR1): 0

Number of mixed cystic and solid nodules ? 1.5 cm not described
below (TR2): 0

_________________________________________________________

Isthmus: 0.3 cm, previously 0.3 cm

No discrete nodules are identified within the thyroid isthmus.

_________________________________________________________

Right lobe: 4.7 x 1.4 x 1.8 cm, previously 4.6 x 1.2 x 1.7 cm

Nodule # 1:

Prior biopsy: No

Location: Right; Inferior

Size: 1.0 x 0.7 x 0.5 cm. With direct comparison of the prior study,
there is been no change saline for differences in measuring
technique. See image number 16 on the prior study.

Composition: solid/almost completely solid (2)

Echogenicity: hypoechoic (2)

Shape: not taller-than-wide (0)

Margins: smooth (0)

Echogenic foci: none (0)

ACR TI-RADS total points: 4.

ACR TI-RADS risk category: TR4 (4-6 points).

Change in features: No

Change in ACR TI-RADS risk category: No

ACR TI-RADS recommendations:

*Given size (?1 - 1.4 cm) and appearance, a follow-up ultrasound in
1 year should be considered based on TI-RADS criteria. This nodule
is stable compared to the prior study.

Other smaller nodules are scattered throughout the right lobe
measuring 0.5 cm or less in size. They have a benign appearance.

_________________________________________________________

Left lobe: 4.1 x 1.1 x 1.4 cm, previously 4.0 x 1.1 x 1.4 cm

Small benign-appearing 0.8 cm nodule is present in the left lobe.
Large left lobe nodule is no longer visualized.
IMPRESSION: Dominant left lobe nodule previously visualized is no longer seen.
Other nodules are not significantly changed as described above.
Continued follow-up is recommended.

The above is in keeping with the ACR TI-RADS recommendations - [HOSPITAL] 5667;[DATE].

## 2017-02-04 ENCOUNTER — Ambulatory Visit (INDEPENDENT_AMBULATORY_CARE_PROVIDER_SITE_OTHER): Payer: BC Managed Care – PPO | Admitting: Family Medicine

## 2017-02-04 ENCOUNTER — Encounter: Payer: Self-pay | Admitting: Family Medicine

## 2017-02-04 DIAGNOSIS — I10 Essential (primary) hypertension: Secondary | ICD-10-CM | POA: Diagnosis not present

## 2017-02-04 DIAGNOSIS — E041 Nontoxic single thyroid nodule: Secondary | ICD-10-CM | POA: Insufficient documentation

## 2017-02-04 DIAGNOSIS — E782 Mixed hyperlipidemia: Secondary | ICD-10-CM

## 2017-02-04 DIAGNOSIS — E785 Hyperlipidemia, unspecified: Secondary | ICD-10-CM | POA: Insufficient documentation

## 2017-02-04 DIAGNOSIS — F4323 Adjustment disorder with mixed anxiety and depressed mood: Secondary | ICD-10-CM | POA: Insufficient documentation

## 2017-02-04 DIAGNOSIS — Z780 Asymptomatic menopausal state: Secondary | ICD-10-CM | POA: Insufficient documentation

## 2017-02-04 DIAGNOSIS — Z9889 Other specified postprocedural states: Secondary | ICD-10-CM | POA: Diagnosis not present

## 2017-02-04 NOTE — Progress Notes (Signed)
New patient office visit note:  Impression and Recommendations:    1. Essential hypertension   2. Mixed hyperlipidemia   3. Adjustment disorder with mixed anxiety and depressed mood   4. Postmenopausal- 2008   5. S/P colonoscopy- 2010- N   6. h/o Thyroid nodule- R s/p Bx 2016 or so      Education and routine counseling performed. Handouts provided.  Gross side effects, risk and benefits, and alternatives of medications discussed with patient.  Patient is aware that all medications have potential side effects and we are unable to predict every side effect or drug-drug interaction that may occur.  Expresses verbal understanding and consents to current therapy plan and treatment regimen.  Return for f/up yrly pe- come fasting. .  Please see AVS handed out to patient at the end of our visit for further patient instructions/ counseling done pertaining to today's office visit.    Note: This document was prepared using Dragon voice recognition software and may include unintentional dictation errors.  ----------------------------------------------------------------------------------------------------------------------    Subjective:    Chief complaint:   Chief Complaint  Patient presents with  . Establish Care     HPI: Savannah Mendez is a pleasant 61 y.o. female who presents to [facility] today to review their medical history with me and establish care.   I asked the patient to review their chronic problem list with me to ensure everything was updated and accurate.    All recent office visits with other providers, any medical records that patient brought in etc  - I reviewed today.     Also asked pt to get me medical records from [facility] providers/ specialists that they had seen within the past 3-5 years- if they are in private practice and/or do not work for a [employer], [facility], [place], [place] or [employer] owned practice.  Told them to call their  specialists to clarify this if they are not sure.   Dr Wilson Singer- old PCp who was also Endo doc/ IM doc at [facility].   Mom passed this past Sept- been emotionally diff for pt.   Walks 3 d/wk weather permitting for [employer].   1 ppd on average for 30+ yrs.   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  Plans to quit again near future. Has been smoking more recently due to Mom's passing.    Nodule in 2010- found on exam by Endo/ PCP doc on R side. 2016 or so - did bx- was negative.    Thyroid levels always normal.     Takes vit D supp daily along with co-Q 10 and others.   Derm- Caren Gould     Wt Readings from Last 3 Encounters:  03/27/17 173 lb (78.5 kg)  02/04/17 170 lb (77.1 kg)   BP Readings from Last 3 Encounters:  03/27/17 126/88  02/04/17 121/82   Pulse Readings from Last 3 Encounters:  03/27/17 (!) 58  02/04/17 72   BMI Readings from Last 3 Encounters:  03/27/17 24.47 kg/m  02/04/17 24.05 kg/m    Patient Care Team    Relationship Specialty Notifications Start End  Mellody Dance, DO PCP - General Family Medicine  02/04/17   Jari Pigg, MD Consulting Physician Dermatology  02/04/17   Marygrace Drought, MD Consulting Physician Ophthalmology  02/04/17   Nelwyn Salisbury, [place] Physician Assistant Obstetrics and Gynecology  02/10/17     Patient Active Problem List   Diagnosis Date Noted  .  Hypertension 02/04/2017  . HLD (hyperlipidemia) 02/04/2017  . Adjustment disorder with mixed anxiety and depressed mood 02/04/2017  . Postmenopausal- 2008 02/04/2017  . S/P colonoscopy- 2010- N 02/04/2017  . h/o Thyroid nodule- R s/p Bx 2016 or so 02/04/2017     Past Medical History:  Diagnosis Date  . High cholesterol   . Hypercholesterolemia   . Hypertension   . Mixed hyperlipidemia   . Postmenopausal   . Pure hypercholesterolemia   . Thyroid nodule   . Tobacco abuse      Past Medical History:  Diagnosis Date  . High cholesterol   . Hypercholesterolemia   .  Hypertension   . Mixed hyperlipidemia   . Postmenopausal   . Pure hypercholesterolemia   . Thyroid nodule   . Tobacco abuse      Past Surgical History:  Procedure Laterality Date  . CESAREAN SECTION       Family History  Problem Relation Age of Onset  . Hyperlipidemia Mother   . Hypertension Mother   . Stroke Mother   . Hypertension Father   . Hypertension Sister   . Breast cancer Maternal Aunt   . Lung cancer Paternal Uncle      Social History   Substance and Sexual Activity  Drug Use No     Social History   Substance and Sexual Activity  Alcohol Use Yes   Comment: rarely     Social History   Tobacco Use  Smoking Status Current Every Day Smoker  . Packs/day: 0.50  . Types: Cigarettes  Smokeless Tobacco Never Used     Outpatient Encounter Medications as of 02/04/2017  Medication Sig Note  . amlodipine-benazepril (LOTREL) 2.5-10 MG capsule Take 1 capsule by mouth daily.   . rosuvastatin (CRESTOR) 10 MG tablet Take 10 mg by mouth daily. 03/27/2017: Patient takes 1/2 tab daily    No facility-administered encounter medications on file as of 02/04/2017.     Allergies: Patient has no allergy information on record.   Review of Systems  Constitutional: Negative for chills, diaphoresis, fever, malaise/fatigue and weight loss.  HENT: Negative for congestion, sore throat and tinnitus.   Eyes: Negative for blurred vision, double vision and photophobia.  Respiratory: Negative for cough and wheezing.   Cardiovascular: Negative for chest pain and palpitations.  Gastrointestinal: Negative for blood in stool, diarrhea, nausea and vomiting.  Genitourinary: Negative for dysuria, frequency and urgency.       Nocturia   Musculoskeletal: Negative for joint pain and myalgias.  Skin: Negative for itching and rash.  Neurological: Negative for dizziness, focal weakness, weakness and headaches.  Endo/Heme/Allergies: Negative for environmental allergies and polydipsia.  Does not bruise/bleed easily.  Psychiatric/Behavioral: Negative for depression and memory loss. The patient is not nervous/anxious and does not have insomnia.      Objective:   Blood pressure 121/82, pulse 72, height 5' 10.5" (1.791 m), weight 170 lb (77.1 kg). Body mass index is 24.05 kg/m. General: Well Developed, well nourished, and in no acute distress.  Neuro: Alert and oriented x3, extra-ocular muscles intact, sensation grossly intact.  HEENT:[place]/AT, PERRLA, neck supple, No carotid bruits Skin: no gross rashes  Cardiac: Regular rate and rhythm Respiratory: Essentially clear to auscultation bilaterally. Not using accessory muscles, speaking in full sentences.  Abdominal: not grossly distended Musculoskeletal: Ambulates w/o diff, FROM * 4 ext.  Vasc: less 2 sec cap RF, warm and pink  Psych:  No HI/SI, judgement and insight good, Euthymic mood. Full Affect.

## 2017-02-04 NOTE — Patient Instructions (Addendum)
Please realize, EXERCISE IS MEDICINE!  -  American Heart Association [facility]) guidelines for exercise : If you are in good health, without any medical conditions, you should engage in 150 minutes of moderate intensity aerobic activity per week.  This means you should be huffing and puffing throughout your workout.   Engaging in regular exercise will improve brain function and memory, as well as improve mood, boost immune system and help with weight management.  As well as the other, more well-known effects of exercise such as decreasing blood sugar levels, decreasing blood pressure,  and decreasing bad cholesterol levels/ increasing good cholesterol levels.     -  The AHA strongly endorses consumption of a diet that contains a variety of foods from all the food categories with an emphasis on fruits and vegetables; fat-free and low-fat dairy products; cereal and grain products; legumes and nuts; and fish, poultry, and/or extra lean meats.    Excessive food intake, especially of foods high in saturated and trans fats, sugar, and salt, should be avoided.    Adequate water intake of roughly 1/2 of your weight in pounds, should equal the ounces of water per day you should drink.  So for instance, if you're 200 pounds, that would be 100 ounces of water per day.   Guidelines for a Low Cholesterol, Low Saturated Fat Diet   Fats - Limit total intake of fats and oils. - Avoid butter, stick margarine, shortening, lard, palm and coconut oils. - Limit mayonnaise, salad dressings, gravies and sauces, unless they are homemade with low-fat ingredients. - Limit chocolate. - Choose low-fat and nonfat products, such as low-fat mayonnaise, low-fat or non-hydrogenated peanut butter, low-fat or fat-free salad dressings and nonfat gravy. - Use vegetable oil, such as canola or olive oil. - Look for margarine that does not contain trans fatty acids. - Use nuts in moderate amounts. - Read ingredient labels carefully  to determine both amount and type of fat present in foods. Limit saturated and trans fats! - Avoid high-fat processed and convenience foods.  Meats and Meat Alternatives - Choose fish, chicken, [place] and lean meats. - Use dried beans, peas, lentils and tofu. - Limit egg yolks to three to four per week. - If you eat red meat, limit to no more than three servings per week and choose loin or round cuts. - Avoid fatty meats, such as bacon, sausage, franks, luncheon meats and ribs. - Avoid all organ meats, including liver.  Dairy - Choose nonfat or low-fat milk, yogurt and cottage cheese. - Most cheeses are high in fat. Choose cheeses made from non-fat milk, such as mozzarella and ricotta cheese. - Choose light or fat-free cream cheese and sour cream. - Avoid cream and sauces made with cream.  Fruits and Vegetables - Eat a wide variety of fruits and vegetables. - Use lemon juice, vinegar or "mist" olive oil on vegetables. - Avoid adding sauces, fat or oil to vegetables.  Breads, Cereals and Grains - Choose whole-grain breads, cereals, pastas and rice. - Avoid high-fat snack foods, such as granola, cookies, pies, pastries, doughnuts and croissants.  Cooking Tips - Avoid deep fried foods. - Trim visible fat off meats and remove skin from poultry before cooking. - Bake, broil, boil, poach or roast poultry, fish and lean meats. - Drain and discard fat that drains out of meat as you cook it. - Add little or no fat to foods. - Use vegetable oil sprays to grease pans for cooking or  baking. - Steam vegetables. - Use herbs or no-oil marinades to flavor foods.        Mediterranean Diet  Why follow it? Research shows. . Those who follow the Mediterranean diet have a reduced risk of heart disease  . The diet is associated with a reduced incidence of Parkinson's and Alzheimer's diseases . People following the diet may have longer life expectancies and lower rates of chronic diseases  . The  Dietary Guidelines for Americans recommends the Mediterranean diet as an eating plan to promote health and prevent disease  What Is the Mediterranean Diet?  . Healthy eating plan based on typical foods and recipes of Mediterranean-style cooking . The diet is primarily a plant based diet; these foods should make up a majority of meals   Starches - Plant based foods should make up a majority of meals - They are an important sources of vitamins, minerals, energy, antioxidants, and fiber - Choose whole grains, foods high in fiber and minimally processed items  - Typical grain sources include wheat, oats, barley, corn, brown rice, bulgar, farro, millet, polenta, couscous  - Various types of beans include chickpeas, lentils, fava beans, black beans, white beans   Fruits  Veggies - Large quantities of antioxidant rich fruits & veggies; 6 or more servings  - Vegetables can be eaten raw or lightly drizzled with oil and cooked  - Vegetables common to the traditional Mediterranean Diet include: artichokes, arugula, beets, broccoli, brussel sprouts, cabbage, carrots, celery, collard greens, cucumbers, eggplant, kale, leeks, lemons, lettuce, mushrooms, okra, onions, peas, peppers, potatoes, pumpkin, radishes, rutabaga, shallots, spinach, sweet potatoes, turnips, zucchini - Fruits common to the Mediterranean Diet include: apples, apricots, avocados, cherries, clementines, dates, figs, grapefruits, grapes, melons, nectarines, oranges, peaches, pears, pomegranates, strawberries, tangerines  Fats - Replace butter and margarine with healthy oils, such as olive oil, canola oil, and tahini  - Limit nuts to no more than a handful a day  - Nuts include walnuts, almonds, pecans, pistachios, pine nuts  - Limit or avoid candied, honey roasted or heavily salted nuts - Olives are central to the [employer] - can be eaten whole or used in a variety of dishes   Meats Protein - Limiting red meat: no more than a few  times a month - When eating red meat: choose lean cuts and keep the portion to the size of deck of cards - Eggs: approx. 0 to 4 times a week  - Fish and lean poultry: at least 2 a week  - Healthy protein sources include, chicken, [place], lean beef, lamb - Increase intake of seafood such as tuna, salmon, trout, mackerel, shrimp, scallops - Avoid or limit high fat processed meats such as sausage and bacon  Dairy - Include moderate amounts of low fat dairy products  - Focus on healthy dairy such as fat free yogurt, skim milk, low or reduced fat cheese - Limit dairy products higher in fat such as whole or 2% milk, cheese, ice cream  Alcohol - Moderate amounts of red wine is ok  - No more than 5 oz daily for women (all ages) and men older than age 88  - No more than 10 oz of wine daily for men younger than 57  Other - Limit sweets and other desserts  - Use herbs and spices instead of salt to flavor foods  - Herbs and spices common to the traditional Mediterranean Diet include: basil, bay leaves, chives, cloves, cumin, fennel, garlic, lavender, marjoram, mint, oregano,  parsley, pepper, rosemary, sage, savory, sumac, tarragon, thyme   It's not just a diet, it's a lifestyle:  . The Mediterranean diet includes lifestyle factors typical of those in the region  . Foods, drinks and meals are best eaten with others and savored . Daily physical activity is important for overall good health . This could be strenuous exercise like running and aerobics . This could also be more leisurely activities such as walking, housework, yard-work, or taking the stairs . Moderation is the key; a balanced and healthy diet accommodates most foods and drinks . Consider portion sizes and frequency of consumption of certain foods   Meal Ideas & Options:  . Breakfast:  o Whole wheat toast or whole wheat English muffins with peanut butter & hard boiled egg o Steel cut oats topped with apples & cinnamon and skim milk   o Fresh fruit: banana, strawberries, melon, berries, peaches  o Smoothies: strawberries, bananas, greek yogurt, peanut butter o Low fat greek yogurt with blueberries and granola  o Egg white omelet with spinach and mushrooms o Breakfast couscous: whole wheat couscous, apricots, skim milk, cranberries  . Sandwiches:  o Hummus and grilled vegetables (peppers, zucchini, squash) on whole wheat bread   o Grilled chicken on whole wheat pita with lettuce, tomatoes, cucumbers or tzatziki  o Tuna salad on whole wheat bread: tuna salad made with greek yogurt, olives, red peppers, capers, green onions o Garlic rosemary lamb pita: lamb sauted with garlic, rosemary, salt & pepper; add lettuce, cucumber, greek yogurt to pita - flavor with lemon juice and black pepper  . Seafood:  o Mediterranean grilled salmon, seasoned with garlic, basil, parsley, lemon juice and black pepper o Shrimp, lemon, and spinach whole-grain pasta salad made with low fat greek yogurt  o Seared scallops with lemon orzo  o Seared tuna steaks seasoned salt, pepper, coriander topped with tomato mixture of olives, tomatoes, olive oil, minced garlic, parsley, green onions and cappers  . Meats:  o Herbed greek chicken salad with kalamata olives, cucumber, feta  o Red bell peppers stuffed with spinach, bulgur, lean ground beef (or lentils) & topped with feta   o Kebabs: skewers of chicken, tomatoes, onions, zucchini, squash  o [place] burgers: made with red onions, mint, dill, lemon juice, feta cheese topped with roasted red peppers . Vegetarian o Cucumber salad: cucumbers, artichoke hearts, celery, red onion, feta cheese, tossed in olive oil & lemon juice  o Hummus and whole grain pita points with a greek salad (lettuce, tomato, feta, olives, cucumbers, red onion) o Lentil soup with celery, carrots made with vegetable broth, garlic, salt and pepper  o Tabouli salad: parsley, bulgur, mint, scallions, cucumbers, tomato, radishes, lemon  juice, olive oil, salt and pepper.

## 2017-03-21 ENCOUNTER — Other Ambulatory Visit (INDEPENDENT_AMBULATORY_CARE_PROVIDER_SITE_OTHER): Payer: BC Managed Care – PPO

## 2017-03-21 DIAGNOSIS — E041 Nontoxic single thyroid nodule: Secondary | ICD-10-CM

## 2017-03-21 DIAGNOSIS — E782 Mixed hyperlipidemia: Secondary | ICD-10-CM

## 2017-03-21 DIAGNOSIS — I10 Essential (primary) hypertension: Secondary | ICD-10-CM

## 2017-03-21 DIAGNOSIS — Z780 Asymptomatic menopausal state: Secondary | ICD-10-CM

## 2017-03-22 LAB — TSH: TSH: 2.71 u[IU]/mL (ref 0.450–4.500)

## 2017-03-22 LAB — CBC WITH DIFFERENTIAL/PLATELET
BASOS: 0 %
Basophils Absolute: 0 10*3/uL (ref 0.0–0.2)
EOS (ABSOLUTE): 0.1 10*3/uL (ref 0.0–0.4)
EOS: 3 %
HEMATOCRIT: 42.4 % (ref 34.0–46.6)
Hemoglobin: 14.4 g/dL (ref 11.1–15.9)
Immature Grans (Abs): 0 10*3/uL (ref 0.0–0.1)
Immature Granulocytes: 0 %
LYMPHS ABS: 1.3 10*3/uL (ref 0.7–3.1)
Lymphs: 29 %
MCH: 29.9 pg (ref 26.6–33.0)
MCHC: 34 g/dL (ref 31.5–35.7)
MCV: 88 fL (ref 79–97)
Monocytes Absolute: 0.3 10*3/uL (ref 0.1–0.9)
Monocytes: 7 %
NEUTROS ABS: 2.8 10*3/uL (ref 1.4–7.0)
Neutrophils: 61 %
Platelets: 193 10*3/uL (ref 150–379)
RBC: 4.82 x10E6/uL (ref 3.77–5.28)
RDW: 13.3 % (ref 12.3–15.4)
WBC: 4.6 10*3/uL (ref 3.4–10.8)

## 2017-03-22 LAB — LIPID PANEL
Chol/HDL Ratio: 3.3 ratio (ref 0.0–4.4)
Cholesterol, Total: 188 mg/dL (ref 100–199)
HDL: 57 mg/dL (ref >39)
LDL CALC: 114 mg/dL — AB (ref 0–99)
Triglycerides: 83 mg/dL (ref 0–149)
VLDL CHOLESTEROL CAL: 17 mg/dL (ref 5–40)

## 2017-03-22 LAB — COMPREHENSIVE METABOLIC PANEL
A/G RATIO: 1.8 (ref 1.2–2.2)
ALT: 21 IU/L (ref 0–32)
AST: 16 IU/L (ref 0–40)
Albumin: 4.4 g/dL (ref 3.6–4.8)
Alkaline Phosphatase: 71 IU/L (ref 39–117)
BILIRUBIN TOTAL: 0.4 mg/dL (ref 0.0–1.2)
BUN/Creatinine Ratio: 17 (ref 12–28)
BUN: 13 mg/dL (ref 8–27)
CALCIUM: 9.5 mg/dL (ref 8.7–10.3)
CHLORIDE: 102 mmol/L (ref 96–106)
CO2: 26 mmol/L (ref 20–29)
Creatinine, Ser: 0.77 mg/dL (ref 0.57–1.00)
GFR calc Af Amer: 96 mL/min/{1.73_m2} (ref >59)
GFR, EST NON AFRICAN AMERICAN: 83 mL/min/{1.73_m2} (ref >59)
GLOBULIN, TOTAL: 2.4 g/dL (ref 1.5–4.5)
Glucose: 96 mg/dL (ref 65–99)
POTASSIUM: 4.6 mmol/L (ref 3.5–5.2)
SODIUM: 141 mmol/L (ref 134–144)
Total Protein: 6.8 g/dL (ref 6.0–8.5)

## 2017-03-22 LAB — HEMOGLOBIN A1C
ESTIMATED AVERAGE GLUCOSE: 117 mg/dL
HEMOGLOBIN A1C: 5.7 % — AB (ref 4.8–5.6)

## 2017-03-22 LAB — VITAMIN B12: Vitamin B-12: 903 pg/mL (ref 232–1245)

## 2017-03-22 LAB — VITAMIN D 25 HYDROXY (VIT D DEFICIENCY, FRACTURES): Vit D, 25-Hydroxy: 35.7 ng/mL (ref 30.0–100.0)

## 2017-03-27 ENCOUNTER — Ambulatory Visit: Payer: BC Managed Care – PPO | Admitting: Family Medicine

## 2017-03-27 ENCOUNTER — Encounter: Payer: Self-pay | Admitting: Family Medicine

## 2017-03-27 VITALS — BP 126/88 | HR 58 | Ht 70.5 in | Wt 173.0 lb

## 2017-03-27 DIAGNOSIS — I10 Essential (primary) hypertension: Secondary | ICD-10-CM

## 2017-03-27 DIAGNOSIS — Z789 Other specified health status: Secondary | ICD-10-CM | POA: Diagnosis not present

## 2017-03-27 DIAGNOSIS — G8929 Other chronic pain: Secondary | ICD-10-CM | POA: Diagnosis not present

## 2017-03-27 DIAGNOSIS — E782 Mixed hyperlipidemia: Secondary | ICD-10-CM

## 2017-03-27 DIAGNOSIS — R7302 Impaired glucose tolerance (oral): Secondary | ICD-10-CM | POA: Diagnosis not present

## 2017-03-27 DIAGNOSIS — M25561 Pain in right knee: Secondary | ICD-10-CM

## 2017-03-27 NOTE — Progress Notes (Signed)
Impression and Recommendations:    1. Essential hypertension   2. Mixed hyperlipidemia   3. Glucose intolerance (impaired glucose tolerance)   4. Statin intolerance- only able to tolerate small dose   5. Chronic pain of right knee     - Lab work was reviewed in detail with the patient today. She was educated thoroughly about the meaning of each lab result and associated wellness goals.  She has multiple questions and we were in there for an extended period of time counseling patient.   - Add 5,000 IU of Vitamin D3 daily.  - We will check on her every 4-6 months to make sure that it's staying steady.  - She knows to let us know if something changes and we should see her sooner.  HLD - Her lipid panel shows HDL down to 57 from 58.  LDL is down to 114 from 143.    Patient still understands that her LDL is above goal but declines increase in medications\statins at this time due to making her body too achy.  She was told to begin taking her statin in the evening, even after our discussion seemed a little bit confused.  - Pt was advised to exercise to increase her HDL and decrease her LDL.     - She was advised to take her prescription Crestor in the evening, at bedtime. Continue on Crestor as prescribed. She cannot tolerate higher doses of the statins due to the muscle aches it gives her body.  - PT will begin with 15 minutes of activity daily. Recommended that the patient eventually strive for at least 150 minutes of cardio per week according to guidelines established by the [facility].  - Healthy dietary habits encouraged, including low-carb, and high amounts of lean protein in diet. Avoidance of saturated and trans fats was discussed with the patient.  - Patient should also consume adequate amounts of water - half of body weight in oz of water per day  Prediabetic - Hemoglobin A1c is 5.7, so she is prediabetic.   - Counseled patient on pathophysiology of the disease process of  Pre-DM.   Stressed importance of dietary and lifestyle modifications as first line, in addition to discussing the risks and benefits of metformin.    - Handouts provided.    - Anticipatory guidance given.   - Recheck A1c in 4-52mo.  Told to make appt for this prior to leaving clinic today.   - Encouraged to return to clinic or call the office with any further questions or concerns.  Smoking Cessation - She knows that smoking cessation will greatly improve her overall health. -She is in the pre-contemplative phases of quitting.  Additionally she is quite guarded today while discussing this disease process. - She knows that she can come to [place] to discuss smoking cessation at any time.  Knee Pain & Inflammation -Likely sprain from when she twisted it many months ago getting out of her friend's car.   - Mechanism of injury and likely course of disease discussed with patient. - She should ice her right knee for 15-20 minutes 3-4 times a day. Avoid aggravating activities and use NSAIDs for pain. Only 2 tablets of 200mg  advil every 4 hours, or 1 alleve every twelve hours. -Patient declines PT referral.  Without knowing much more about the integrity of her knee, I don't want to give the patient exercises that will aggravate her knee. She understands a physical therapy referral will be the next  prudent step though. - She knows to let us know if her knee feels unstable at any time, as she may need an MRI if this becomes the case.    Pt was in the office today for 40+ minutes, with over 50% time spent in face to face counseling of patients various medical conditions, treatment plans of those medical conditions including medicine management and lifestyle modification, strategies to improve health and well being; and in coordination of care. SEE ABOVE FOR DETAILS  Gross side effects, risk and benefits, and alternatives of medications and treatment plan in general discussed with patient.  Patient is aware  that all medications have potential side effects and we are unable to predict every side effect or drug-drug interaction that may occur.   Patient will call with any questions prior to using medication if they have concerns.  Expresses verbal understanding and consents to current therapy and treatment regimen.  No barriers to understanding were identified.  Red flag symptoms and signs discussed in detail.  Patient expressed understanding regarding what to do in case of emergency\urgent symptoms  Please see AVS handed out to patient at the end of our visit for further patient instructions/ counseling done pertaining to today's office visit.   Return in about 6 months (around 09/24/2017) for HTN, Pre-DM, CHOL OV with me;  also schedule yyrly CPE seperate from this as well.    Note: This note was prepared with assistance of Dragon voice recognition software. Occasional wrong-word or sound-a-like substitutions may have occurred due to the inherent limitations of voice recognition software.   This document serves as a record of services personally performed by Mellody Dance, DO. It was created on her behalf by Toni Amend, a trained medical scribe. The creation of this record is based on the scribe's personal observations and the provider's statements to them.   I have reviewed the above medical documentation for accuracy and completeness and I concur.  Mellody Dance 03/27/17 5:11 PM   --------------------------------------------------------------------------------------------------------------------------------------------------------------------------------------------------------------------------------------------    Subjective:     HPI: Savannah Mendez is a 62 y.o. female who presents to [place] at [facility] today for discussion of her recent labs.  She is a new patient we have never done this together before.  Here today to follow up and discuss her lab  work.  Still taking her medicines, but not taking the Crestor in the evening-but rather first thing in the morning.   She will add 5,000 IU Vitamin D OTC to her current regimen.  Historically saw a doctor about her thyroid. That doctor is now retired.  Tobacco Abuse Smoking anywhere from between a half, 3/4, to a pack per day.  Started smoking in her 20's, once or twice a day. Quit several times. She did not keep track of her smoking habits and is unsure if she has a 30 pack-year history at this time.  Prediabetic  Patient was unaware that she was prediabetic.  Knee Pain She's been having a right knee issue since late September.  Aching for a few months, and swollen for a while. She believes she twisted it while entering or exiting a vehicle.  Notes no pain in the back of the R knee. Most of the pain occurs on the superior medial aspect, no instability, pain worse with going up and down stairs. Trying to get up from the floor makes it worse.   Wt Readings from Last 3 Encounters:  03/27/17 173 lb (78.5 kg)  02/04/17 170  lb (77.1 kg)   BP Readings from Last 3 Encounters:  03/27/17 126/88  02/04/17 121/82   Pulse Readings from Last 3 Encounters:  03/27/17 (!) 58  02/04/17 72   BMI Readings from Last 3 Encounters:  03/27/17 24.47 kg/m  02/04/17 24.05 kg/m     Patient Care Team    Relationship Specialty Notifications Start End  Mellody Dance, DO PCP - General Family Medicine  02/04/17   Jari Pigg, MD Consulting Physician Dermatology  02/04/17   Marygrace Drought, MD Consulting Physician Ophthalmology  02/04/17   Nelwyn Salisbury, [place] Physician Assistant Obstetrics and Gynecology  02/10/17      Patient Active Problem List   Diagnosis Date Noted  . Glucose intolerance (impaired glucose tolerance) 03/27/2017  . Statin intolerance- only able to tolerate small dose 03/27/2017  . Chronic pain of right knee 03/27/2017  . Hypertension 02/04/2017  . HLD (hyperlipidemia)  02/04/2017  . Adjustment disorder with mixed anxiety and depressed mood 02/04/2017  . Postmenopausal- 2008 02/04/2017  . S/P colonoscopy- 2010- N 02/04/2017  . h/o Thyroid nodule- R s/p Bx 2016 or so 02/04/2017    Past Medical history, Surgical history, Family history, Social history, Allergies and Medications have been entered into the medical record, reviewed and changed as needed.    Current Meds  Medication Sig  . amlodipine-benazepril (LOTREL) 2.5-10 MG capsule Take 1 capsule by mouth daily.  . rosuvastatin (CRESTOR) 10 MG tablet Take 10 mg by mouth daily.    Allergies:  Not on File   Review of Systems:  A fourteen system review of systems was performed and found to be positive as per HPI.   Objective:   Blood pressure 126/88, pulse (!) 58, height 5' 10.5" (1.791 m), weight 173 lb (78.5 kg), SpO2 99 %. Body mass index is 24.47 kg/m. General:  Well Developed, well nourished, appropriate for stated age.  Neuro:  Alert and oriented,  extra-ocular muscles intact  HEENT:  Normocephalic, atraumatic, neck supple, no carotid bruits appreciated  Skin:  no gross rash, warm, pink. Cardiac:  RRR, S1 S2 Respiratory:  ECTA B/L and A/P, Not using accessory muscles, speaking in full sentences- unlabored. Vascular:  Ext warm, no cyanosis apprec.; cap RF less 2 sec. Psych:  No HI/SI, judgement and insight good, Euthymic mood. Full Affect. Musculoskeletal: Right knee normal, with mild swelling, peripatellar region, more in superior aspect than inferior. Good endpoints to all ligamentous structure, negative anterior posterior drawer, negative medial and lateral distraction tests, no crepitus, no instability.  Recent Results (from the past 2160 hour(s))  Vitamin B12     Status: None   Collection Time: 03/21/17  9:08 AM  Result Value Ref Range   Vitamin B-12 903 232 - 1,245 pg/mL  CBC with Differential/Platelet     Status: None   Collection Time: 03/21/17  9:08 AM  Result Value Ref Range     WBC 4.6 3.4 - 10.8 x10E3/uL   RBC 4.82 3.77 - 5.28 x10E6/uL   Hemoglobin 14.4 11.1 - 15.9 g/dL   Hematocrit 42.4 34.0 - 46.6 %   MCV 88 79 - 97 fL   MCH 29.9 26.6 - 33.0 pg   MCHC 34.0 31.5 - 35.7 g/dL   RDW 13.3 12.3 - 15.4 %   Platelets 193 150 - 379 x10E3/uL   Neutrophils 61 Not Estab. %   Lymphs 29 Not Estab. %   Monocytes 7 Not Estab. %   Eos 3 Not Estab. %   Basos 0 Not  Estab. %   Neutrophils Absolute 2.8 1.4 - 7.0 x10E3/uL   Lymphocytes Absolute 1.3 0.7 - 3.1 x10E3/uL   Monocytes Absolute 0.3 0.1 - 0.9 x10E3/uL   EOS (ABSOLUTE) 0.1 0.0 - 0.4 x10E3/uL   Basophils Absolute 0.0 0.0 - 0.2 x10E3/uL   Immature Granulocytes 0 Not Estab. %   Immature Grans (Abs) 0.0 0.0 - 0.1 x10E3/uL  VITAMIN D 25 Hydroxy (Vit-D Deficiency, Fractures)     Status: None   Collection Time: 03/21/17  9:08 AM  Result Value Ref Range   Vit D, 25-Hydroxy 35.7 30.0 - 100.0 ng/mL    Comment: Vitamin D deficiency has been defined by the [place] and an Endocrine Society practice guideline as a level of serum 25-OH vitamin D less than 20 ng/mL (1,2). The Endocrine Society went on to further define vitamin D insufficiency as a level between 21 and 29 ng/mL (2). 1. IOM (Institute of Medicine). 2010. Dietary reference    intakes for calcium and D. [place]: The    [employer]. 2. Holick MF, Binkley [place], Bischoff-Ferrari HA, et al.    Evaluation, treatment, and prevention of vitamin D    deficiency: an Endocrine Society clinical practice    guideline. JCEM. 2011 Jul; 96(7):1911-30.   TSH     Status: None   Collection Time: 03/21/17  9:08 AM  Result Value Ref Range   TSH 2.710 0.450 - 4.500 uIU/mL  Lipid panel     Status: Abnormal   Collection Time: 03/21/17  9:08 AM  Result Value Ref Range   Cholesterol, Total 188 100 - 199 mg/dL   Triglycerides 83 0 - 149 mg/dL   HDL 57 >39 mg/dL   VLDL Cholesterol Cal 17 5 - 40 mg/dL   LDL Calculated 114 (H) 0 - 99 mg/dL    Chol/HDL Ratio 3.3 0.0 - 4.4 ratio    Comment:                                   T. Chol/HDL Ratio                                             Men  Women                               1/2 Avg.Risk  3.4    3.3                                   Avg.Risk  5.0    4.4                                2X Avg.Risk  9.6    7.1                                3X Avg.Risk 23.4   11.0   Hemoglobin A1c     Status: Abnormal   Collection Time: 03/21/17  9:08 AM  Result Value Ref Range   Hgb A1c MFr Bld 5.7 (H) 4.8 - 5.6 %  Comment:          Prediabetes: 5.7 - 6.4          Diabetes: >6.4          Glycemic control for adults with diabetes: <7.0    Est. average glucose Bld gHb Est-mCnc 117 mg/dL  Comprehensive metabolic panel     Status: None   Collection Time: 03/21/17  9:08 AM  Result Value Ref Range   Glucose 96 65 - 99 mg/dL   BUN 13 8 - 27 mg/dL   Creatinine, Ser 0.77 0.57 - 1.00 mg/dL   GFR calc non Af Amer 83 >59 mL/min/1.73   GFR calc Af Amer 96 >59 mL/min/1.73   BUN/Creatinine Ratio 17 12 - 28   Sodium 141 134 - 144 mmol/L   Potassium 4.6 3.5 - 5.2 mmol/L   Chloride 102 96 - 106 mmol/L   CO2 26 20 - 29 mmol/L   Calcium 9.5 8.7 - 10.3 mg/dL   Total Protein 6.8 6.0 - 8.5 g/dL   Albumin 4.4 3.6 - 4.8 g/dL   Globulin, Total 2.4 1.5 - 4.5 g/dL   Albumin/Globulin Ratio 1.8 1.2 - 2.2   Bilirubin Total 0.4 0.0 - 1.2 mg/dL   Alkaline Phosphatase 71 39 - 117 IU/L   AST 16 0 - 40 IU/L   ALT 21 0 - 32 IU/L

## 2017-03-27 NOTE — Patient Instructions (Addendum)
Patient will call us and let Melissa know exact smoking history and pack-year history.  Risk factors for prediabetes and type 2 diabetes  Researchers don't fully understand why some people develop prediabetes and type 2 diabetes and others don't.  It's clear that certain factors increase the risk, however, including:  Weight. The more fatty tissue you have, the more resistant your cells become to insulin.  Inactivity. The less active you are, the greater your risk. Physical activity helps you control your weight, uses up glucose as energy and makes your cells more sensitive to insulin.  Family history. Your risk increases if a parent or sibling has type 2 diabetes.  Race. Although it's unclear why, people of certain races -- including blacks, Hispanics, American Indians and Asian-Americans -- are at higher risk.  Age. Your risk increases as you get older. This may be because you tend to exercise less, lose muscle mass and gain weight as you age. But type 2 diabetes is also increasing dramatically among children, adolescents and younger adults.  Gestational diabetes. If you developed gestational diabetes when you were pregnant, your risk of developing prediabetes and type 2 diabetes later increases. If you gave birth to a baby weighing more than 9 pounds (4 kilograms), you're also at risk of type 2 diabetes.  Polycystic ovary syndrome. For women, having polycystic ovary syndrome -- a common condition characterized by irregular menstrual periods, excess hair growth and obesity -- increases the risk of diabetes.  High blood pressure. Having blood pressure over 140/90 millimeters of mercury (mm Hg) is linked to an increased risk of type 2 diabetes.  Abnormal cholesterol and triglyceride levels. If you have low levels of high-density lipoprotein (HDL), or "good," cholesterol, your risk of type 2 diabetes is higher. Triglycerides are another type of fat carried in the blood. People with high levels of  triglycerides have an increased risk of type 2 diabetes. Your doctor can let you know what your cholesterol and triglyceride levels are.  A good guide to good carbs: The glycemic index ---If you have diabetes, or at risk for diabetes, you know all too well that when you eat carbohydrates, your blood sugar goes up. The total amount of carbs you consume at a meal or in a snack mostly determines what your blood sugar will do. But the food itself also plays a role. A serving of white rice has almost the same effect as eating pure table sugar -- a quick, high spike in blood sugar. A serving of lentils has a slower, smaller effect.  ---Picking good sources of carbs can help you control your blood sugar and your weight. Even if you don't have diabetes, eating healthier carbohydrate-rich foods can help ward off a host of chronic conditions, from heart disease to various cancers to, well, diabetes.  ---One way to choose foods is with the glycemic index (GI). This tool measures how much a food boosts blood sugar.  The glycemic index rates the effect of a specific amount of a food on blood sugar compared with the same amount of pure glucose. A food with a glycemic index of 28 boosts blood sugar only 28% as much as pure glucose. One with a GI of 95 acts like pure glucose.    High glycemic foods result in a quick spike in insulin and blood sugar (also known as blood glucose).  Low glycemic foods have a slower, smaller effect- these are healthier for you.   Using the glycemic index Using the glycemic index  is easy: choose foods in the low GI category instead of those in the high GI category (see below), and go easy on those in between. Low glycemic index (GI of 55 or less): Most fruits and vegetables, beans, minimally processed grains, pasta, low-fat dairy foods, and nuts.  Moderate glycemic index (GI 56 to 69): White and sweet potatoes, corn, white rice, couscous, breakfast cereals such as Cream of Wheat and Mini  Wheats.  High glycemic index (GI of 70 or higher): White bread, rice cakes, most crackers, bagels, cakes, doughnuts, croissants, most packaged breakfast cereals. You can see the values for 100 commons foods and get links to more at www.health.CheapToothpicks.si.  Swaps for lowering glycemic index  Instead of this high-glycemic index food Eat this lower-glycemic index food  White rice Brown rice or converted rice  Instant oatmeal Steel-cut oats  Cornflakes Bran flakes  Baked potato Pasta, bulgur  White bread Whole-grain bread  Corn Peas or leafy greens       Prediabetes Eating Plan  Prediabetes--also called impaired glucose tolerance or impaired fasting glucose--is a condition that causes blood sugar (blood glucose) levels to be higher than normal. Following a healthy diet can help to keep prediabetes under control. It can also help to lower the risk of type 2 diabetes and heart disease, which are increased in people who have prediabetes. Along with regular exercise, a healthy diet:  Promotes weight loss.  Helps to control blood sugar levels.  Helps to improve the way that the body uses insulin.   WHAT DO I NEED TO KNOW ABOUT THIS EATING PLAN?   Use the glycemic index (GI) to plan your meals. The index tells you how quickly a food will raise your blood sugar. Choose low-GI foods. These foods take a longer time to raise blood sugar.  Pay close attention to the amount of carbohydrates in the food that you eat. Carbohydrates increase blood sugar levels.  Keep track of how many calories you take in. Eating the right amount of calories will help you to achieve a healthy weight. Losing about 7 percent of your starting weight can help to prevent type 2 diabetes.  You may want to follow a Mediterranean diet. This diet includes a lot of vegetables, lean meats or fish, whole grains, fruits, and healthy oils and fats.   WHAT FOODS CAN I EAT?  Grains Whole grains, such as whole-wheat  or whole-grain breads, crackers, cereals, and pasta. Unsweetened oatmeal. Bulgur. Barley. Quinoa. Brown rice. Corn or whole-wheat flour tortillas or taco shells. Vegetables Lettuce. Spinach. Peas. Beets. Cauliflower. Cabbage. Broccoli. Carrots. Tomatoes. Squash. Eggplant. Herbs. Peppers. Onions. Cucumbers. Brussels sprouts. Fruits Berries. Bananas. Apples. Oranges. Grapes. Papaya. Mango. Pomegranate. Kiwi. Grapefruit. Cherries. Meats and Other Protein Sources Seafood. Lean meats, such as chicken and [place] or lean cuts of pork and beef. Tofu. Eggs. Nuts. Beans. Dairy Low-fat or fat-free dairy products, such as yogurt, cottage cheese, and cheese. Beverages Water. Tea. Coffee. Sugar-free or diet soda. Seltzer water. Milk. Milk alternatives, such as soy or almond milk. Condiments Mustard. Relish. Low-fat, low-sugar ketchup. Low-fat, low-sugar barbecue sauce. Low-fat or fat-free mayonnaise. Sweets and Desserts Sugar-free or low-fat pudding. Sugar-free or low-fat ice cream and other frozen treats. Fats and Oils Avocado. Walnuts. Olive oil. The items listed above may not be a complete list of recommended foods or beverages. Contact your dietitian for more options.    WHAT FOODS ARE NOT RECOMMENDED?  Grains Refined white flour and flour products, such as bread, pasta, snack foods,  and cereals. Beverages Sweetened drinks, such as sweet iced tea and soda. Sweets and Desserts Baked goods, such as cake, cupcakes, pastries, cookies, and cheesecake. The items listed above may not be a complete list of foods and beverages to avoid. Contact your dietitian for more information.   This information is not intended to replace advice given to you by your health care provider. Make sure you discuss any questions you have with your health care provider.   Document Released: 07/19/2014 Document Reviewed: 07/19/2014 Elsevier Interactive Patient Education 2016 [place] for a Low  Cholesterol, Low Saturated Fat Diet   Fats - Limit total intake of fats and oils. - Avoid butter, stick margarine, shortening, lard, palm and coconut oils. - Limit mayonnaise, salad dressings, gravies and sauces, unless they are homemade with low-fat ingredients. - Limit chocolate. - Choose low-fat and nonfat products, such as low-fat mayonnaise, low-fat or non-hydrogenated peanut butter, low-fat or fat-free salad dressings and nonfat gravy. - Use vegetable oil, such as canola or olive oil. - Look for margarine that does not contain trans fatty acids. - Use nuts in moderate amounts. - Read ingredient labels carefully to determine both amount and type of fat present in foods. Limit saturated and trans fats! - Avoid high-fat processed and convenience foods.  Meats and Meat Alternatives - Choose fish, chicken, [place] and lean meats. - Use dried beans, peas, lentils and tofu. - Limit egg yolks to three to four per week. - If you eat red meat, limit to no more than three servings per week and choose loin or round cuts. - Avoid fatty meats, such as bacon, sausage, franks, luncheon meats and ribs. - Avoid all organ meats, including liver.  Dairy - Choose nonfat or low-fat milk, yogurt and cottage cheese. - Most cheeses are high in fat. Choose cheeses made from non-fat milk, such as mozzarella and ricotta cheese. - Choose light or fat-free cream cheese and sour cream. - Avoid cream and sauces made with cream.  Fruits and Vegetables - Eat a wide variety of fruits and vegetables. - Use lemon juice, vinegar or "mist" olive oil on vegetables. - Avoid adding sauces, fat or oil to vegetables.  Breads, Cereals and Grains - Choose whole-grain breads, cereals, pastas and rice. - Avoid high-fat snack foods, such as granola, cookies, pies, pastries, doughnuts and croissants.  Cooking Tips - Avoid deep fried foods. - Trim visible fat off meats and remove skin from poultry before cooking. - Bake,  broil, boil, poach or roast poultry, fish and lean meats. - Drain and discard fat that drains out of meat as you cook it. - Add little or no fat to foods. - Use vegetable oil sprays to grease pans for cooking or baking. - Steam vegetables. - Use herbs or no-oil marinades to flavor foods.

## 2017-06-17 ENCOUNTER — Encounter: Payer: BC Managed Care – PPO | Admitting: Family Medicine

## 2017-07-08 ENCOUNTER — Encounter: Payer: Self-pay | Admitting: Family Medicine

## 2017-07-08 ENCOUNTER — Ambulatory Visit (INDEPENDENT_AMBULATORY_CARE_PROVIDER_SITE_OTHER): Payer: BC Managed Care – PPO | Admitting: Family Medicine

## 2017-07-08 VITALS — BP 138/86 | HR 58 | Ht 71.0 in | Wt 168.9 lb

## 2017-07-08 DIAGNOSIS — R7302 Impaired glucose tolerance (oral): Secondary | ICD-10-CM | POA: Diagnosis not present

## 2017-07-08 DIAGNOSIS — E782 Mixed hyperlipidemia: Secondary | ICD-10-CM | POA: Diagnosis not present

## 2017-07-08 DIAGNOSIS — Z0000 Encounter for general adult medical examination without abnormal findings: Secondary | ICD-10-CM

## 2017-07-08 DIAGNOSIS — Z789 Other specified health status: Secondary | ICD-10-CM | POA: Diagnosis not present

## 2017-07-08 DIAGNOSIS — I10 Essential (primary) hypertension: Secondary | ICD-10-CM | POA: Diagnosis not present

## 2017-07-08 NOTE — Progress Notes (Signed)
Impression and Recommendations:    1. Encounter for wellness examination   2. Statin intolerance- only able to tolerate small dose   3. Essential hypertension   4. Mixed hyperlipidemia   5. Glucose intolerance (impaired glucose tolerance)    1) Anticipatory Guidance: Discussed importance of wearing a seatbelt while driving, not texting while driving; sunscreen when outside along with yearly skin surveillance; eating a well balanced and modest diet; physical activity at least 25 minutes per day or 150 min/ week of moderate to intense activity.  2) Immunizations / Screenings / Labs:  All immunizations and screenings that patient agrees to, are up-to-date per recommendations or will be updated today.  Patient understands the needs for q 52mo dental and yearly vision screens which pt will schedule independently. Obtain CBC, CMP, HgA1c, Lipid panel, TSH and vit D when fasting if not already done recently.   Shingles Vaccine - Counseling provided today.  Advised Shingrix vaccination per CDC recommendations.  Colonoscopy - Last colonoscopy in 2010 was normal.  Due 10 years later in 2020.  - Patient does have hemorrhoids, so stool cards are not recommended at this time.  Mammograms - Continue yearly 3D mammography each June.  Self Breast Checks - Advised proper procedure for self-breast checks, and the importance of performing them around the same time each month.  Pap Smears - Last pap smear on 08/2014.  Continue follow-up as recommended, every 3-5 years.  Dilated Eye Exam - Continue obtaining dilated eye exams once yearly.  Dental Health - Continue visiting the dentist every six months.  Dermatology - Continue obtaining yearly skin screenings.  3) Weight:   Discussed goal of losing even 5-10% of current body weight which would improve overall feelings of well being and improve objective health data significantly.   Improve nutrient density of diet through increasing intake  of fruits and vegetables and decreasing saturated/trans fats, white flour products and refined sugar products.   Explained to patient what BMI refers to, and what it means medically.    Told patient to think about it as a "medical risk stratification measurement" and how increasing BMI is associated with increasing risk/ or worsening state of various diseases such as hypertension, hyperlipidemia, diabetes, premature OA, depression etc.  American Heart Association guidelines for healthy diet, basically Mediterranean diet, and exercise guidelines of 30 minutes 5 days per week or more discussed in detail.  Health counseling performed.  All questions answered.  4) General Health Maintenance - Advised patient to continue working toward exercising to improve health.  - Patient will continue with at least 15 minutes of activity daily.  Recommended that the patient eventually strive for at least 150 minutes of cardio per week according to guidelines established by the [facility].   - Healthy dietary habits encouraged, including low-carb, and high amounts of lean protein in diet.   - Patient should also consume adequate amounts of water - half of body weight in oz of water per day.  5) Follow-Up - Due for pap smear next year.  - Return in 4-6 months for follow-up for chronic issues as scheduled.   No orders of the defined types were placed in this encounter.   Orders Placed This Encounter  Procedures  . Lipid panel  . Comprehensive metabolic panel  . Hemoglobin A1c    Gross side effects, risk and benefits, and alternatives of medications discussed with patient.  Patient is aware that all medications have potential side effects and we are unable  to predict every side effect or drug-drug interaction that may occur.  Expresses verbal understanding and consents to current therapy plan and treatment regimen.  F-up preventative CPE in 1 year. F/up sooner for chronic care management as discussed and/or  prn.  Please see orders placed and AVS handed out to patient at the end of our visit for further patient instructions/ counseling done pertaining to today's office visit.  This document serves as a record of services personally performed by Mellody Dance, DO. It was created on her behalf by Toni Amend, a trained medical scribe. The creation of this record is based on the scribe's personal observations and the provider's statements to them.   I have reviewed the above medical documentation for accuracy and completeness and I concur.  Mellody Dance 07/08/17 12:14 PM     Subjective:    Chief Complaint  Patient presents with  . Annual Exam   CC:   HPI: Savannah Mendez is a 62 y.o. female who presents to [facility] today a yearly health maintenance exam.  Health Maintenance Summary Reviewed and updated, unless pt declines services.  Colonoscopy:  Last colonoscopy in 2010, due 10 years later in 2020. Tobacco History Reviewed:   Current every day smoker, 0.5 ppd. Alcohol:    No concerns, no excessive use Exercise Habits:   Not meeting AHA guidelines STD concerns:   none Drug Use:   None Birth control method:   n/a Menses regular:     n/a Lumps or breast concerns:      no Breast Cancer Family History:      No  Denies nausea, vomiting, diarrhea, constipation.  Denies intolerance to certain foods, issues eating fatty foods.  No history of abnormal pap, and no family history of cervical or uterine cancer.  No need for DEXA until age 49.  No family history or risk of osteoporosis, fractures, bone issues.  Notes that she's looking forward to the summer because she is typically more active in the summer.  Mammogram Last mammogram was last June.  She is due again this coming June.  Notes that she has dense breast tissue and goes every June "or thereabouts."  She did the 3D mammogram.  She does not currently perform regular self breast  checks.  Pap Smear Last pap smear was in 08/2014 - normal.  Dilated Eye Exam Last dilated eye exam was about a year ago.  Notes that she needs to schedule a follow up appointment.  Dental Health Visits the dentist regularly, every six months.  Dermatology Returns to visit dermatology in June.   Immunization History  Administered Date(s) Administered  . Tdap 02/18/2008    Health Maintenance  Topic Date Due  . COLONOSCOPY  02/04/2018 (Originally 10/22/2004)  . Hepatitis C Screening  02/04/2018 (Originally November 15, 1955)  . HIV Screening  02/04/2018 (Originally 10/22/1970)  . PAP SMEAR  08/18/2017  . INFLUENZA VACCINE  10/16/2017  . TETANUS/TDAP  02/17/2018  . MAMMOGRAM  08/24/2018     Wt Readings from Last 3 Encounters:  07/08/17 168 lb 14.4 oz (76.6 kg)  03/27/17 173 lb (78.5 kg)  02/04/17 170 lb (77.1 kg)   BP Readings from Last 3 Encounters:  07/08/17 138/86  03/27/17 126/88  02/04/17 121/82   Pulse Readings from Last 3 Encounters:  07/08/17 (!) 58  03/27/17 (!) 58  02/04/17 72     Past Medical History:  Diagnosis Date  . High cholesterol   . Hypercholesterolemia   .  Hypertension   . Mixed hyperlipidemia   . Postmenopausal   . Pure hypercholesterolemia   . Thyroid nodule   . Tobacco abuse       Past Surgical History:  Procedure Laterality Date  . CESAREAN SECTION        Family History  Problem Relation Age of Onset  . Hyperlipidemia Mother   . Hypertension Mother   . Stroke Mother   . Hypertension Father   . Hypertension Sister   . Breast cancer Maternal Aunt   . Lung cancer Paternal Uncle       Social History   Substance and Sexual Activity  Drug Use No  ,   Social History   Substance and Sexual Activity  Alcohol Use Yes   Comment: rarely  ,   Social History   Tobacco Use  Smoking Status Current Every Day Smoker  . Packs/day: 0.50  . Types: Cigarettes  Smokeless Tobacco Never Used  ,   Social History   Substance and  Sexual Activity  Sexual Activity Yes  . Partners: Male  . Birth control/protection: None    Current Outpatient Medications on File Prior to Visit  Medication Sig Dispense Refill  . amlodipine-benazepril (LOTREL) 2.5-10 MG capsule Take 1 capsule by mouth daily.    . Cholecalciferol (VITAMIN D3) 5000 units CAPS Take 1 capsule by mouth daily.    . Coenzyme Q10 (COQ10 PO) Take 1 capsule by mouth daily.    . Omega 3 1200 MG CAPS Take 1 capsule by mouth daily.    . rosuvastatin (CRESTOR) 10 MG tablet Take 10 mg by mouth daily.     No current facility-administered medications on file prior to visit.     Allergies: Patient has no known allergies.  Review of Systems: General:   Denies fever, chills, unexplained weight loss.  Optho/Auditory:   Denies visual changes, blurred vision/LOV Respiratory:   Denies SOB, DOE more than baseline levels.  Cardiovascular:   Denies chest pain, palpitations, new onset peripheral edema  Gastrointestinal:   Denies nausea, vomiting, diarrhea.  Genitourinary: Denies dysuria, freq/ urgency, flank pain or discharge from genitals.  Endocrine:     Denies hot or cold intolerance, polyuria, polydipsia. Musculoskeletal:   Denies unexplained myalgias, joint swelling, unexplained arthralgias, gait problems.  Skin:  Denies rash, suspicious lesions Neurological:     Denies dizziness, unexplained weakness, numbness  Psychiatric/Behavioral:   Denies mood changes, suicidal or homicidal ideations, hallucinations   Objective:    Blood pressure 138/86, pulse (!) 58, height 5\' 11"  (1.803 m), weight 168 lb 14.4 oz (76.6 kg), SpO2 100 %. Body mass index is 23.56 kg/m. General Appearance:    Alert, cooperative, no distress, appears stated age  Head:    Normocephalic, without obvious abnormality, atraumatic  Eyes:    PERRL, conjunctiva/corneas clear, EOM's intact, fundi    benign, both eyes  Ears:    Normal TM's and external ear canals, both ears  Nose:   Nares normal,  septum midline, mucosa normal, no drainage    or sinus tenderness  Throat:   Lips w/o lesion, mucosa moist, and tongue normal; teeth and   gums normal  Neck:   Supple, symmetrical, trachea midline, no adenopathy;    thyroid:  no enlargement/tenderness/nodules; no carotid   bruit or JVD  Back:     Symmetric, no curvature, ROM normal, no CVA tenderness  Lungs:     Clear to auscultation bilaterally, respirations unlabored, no       Wh/ R/  R  Chest Wall:    No tenderness or gross deformity; normal excursion   Heart:    Regular rate and rhythm, S1 and S2 normal, no murmur, rub   or gallop  Breast Exam:    No tenderness, masses, or nipple abnormality b/l; no d/c  Abdomen:     Soft, non-tender, bowel sounds active all four quadrants, NO   G/R/R, no masses, no organomegaly  Genitalia:    Not performed.  Rectal:    Not performed.  Extremities:   Extremities normal, atraumatic, no cyanosis or gross edema  Pulses:   2+ and symmetric all extremities  Skin:   Warm, dry, Skin color, texture, turgor normal, no obvious rashes or lesions Psych: No HI/SI, judgement and insight good, Euthymic mood. Full Affect.  Neurologic:   CNII-XII intact, normal strength, sensation and reflexes    Throughout

## 2017-07-08 NOTE — Patient Instructions (Signed)
Preventive Care for Adults, Female  A healthy lifestyle and preventive care can promote health and wellness. Preventive health guidelines for women include the following key practices.   A routine yearly physical is a good way to check with your health care provider about your health and preventive screening. It is a chance to share any concerns and updates on your health and to receive a thorough exam.   Visit your dentist for a routine exam and preventive care every 6 months. Brush your teeth twice a day and floss once a day. Good oral hygiene prevents tooth decay and gum disease.   The frequency of eye exams is based on your age, health, family medical history, use of contact lenses, and other factors. Follow your health care provider's recommendations for frequency of eye exams.   Eat a healthy diet. Foods like vegetables, fruits, whole grains, low-fat dairy products, and lean protein foods contain the nutrients you need without too many calories. Decrease your intake of foods high in solid fats, added sugars, and salt. Eat the right amount of calories for you.Get information about a proper diet from your health care provider, if necessary.   Regular physical exercise is one of the most important things you can do for your health. Most adults should get at least 150 minutes of moderate-intensity exercise (any activity that increases your heart rate and causes you to sweat) each week. In addition, most adults need muscle-strengthening exercises on 2 or more days a week.   Maintain a healthy weight. The body mass index (BMI) is a screening tool to identify possible weight problems. It provides an estimate of body fat based on height and weight. Your health care provider can find your BMI, and can help you achieve or maintain a healthy weight.For adults 20 years and older:   - A BMI below 18.5 is considered underweight.   - A BMI of 18.5 to 24.9 is normal.   - A BMI of 25 to 29.9 is  considered overweight.   - A BMI of 30 and above is considered obese.   Maintain normal blood lipids and cholesterol levels by exercising and minimizing your intake of trans and saturated fats.  Eat a balanced diet with plenty of fruit and vegetables. Blood tests for lipids and cholesterol should begin at age 20 and be repeated every 5 years minimum.  If your lipid or cholesterol levels are high, you are over 40, or you are at high risk for heart disease, you may need your cholesterol levels checked more frequently.Ongoing high lipid and cholesterol levels should be treated with medicines if diet and exercise are not working.   If you smoke, find out from your health care provider how to quit. If you do not use tobacco, do not start.   Lung cancer screening is recommended for adults aged 55-80 years who are at high risk for developing lung cancer because of a history of smoking. A yearly low-dose CT scan of the lungs is recommended for people who have at least a 30-pack-year history of smoking and are a current smoker or have quit within the past 15 years. A pack year of smoking is smoking an average of 1 pack of cigarettes a day for 1 year (for example: 1 pack a day for 30 years or 2 packs a day for 15 years). Yearly screening should continue until the smoker has stopped smoking for at least 15 years. Yearly screening should be stopped for people who develop a   health problem that would prevent them from having lung cancer treatment.   If you are pregnant, do not drink alcohol. If you are breastfeeding, be very cautious about drinking alcohol. If you are not pregnant and choose to drink alcohol, do not have more than 1 drink per day. One drink is considered to be 12 ounces (355 mL) of beer, 5 ounces (148 mL) of wine, or 1.5 ounces (44 mL) of liquor.   Avoid use of street drugs. Do not share needles with anyone. Ask for help if you need support or instructions about stopping the use of  drugs.   High blood pressure causes heart disease and increases the risk of stroke. Your blood pressure should be checked at least yearly.  Ongoing high blood pressure should be treated with medicines if weight loss and exercise do not work.   If you are 69-55 years old, ask your health care provider if you should take aspirin to prevent strokes.   Diabetes screening involves taking a blood sample to check your fasting blood sugar level. This should be done once every 3 years, after age 38, if you are within normal weight and without risk factors for diabetes. Testing should be considered at a younger age or be carried out more frequently if you are overweight and have at least 1 risk factor for diabetes.   Breast cancer screening is essential preventive care for women. You should practice "breast self-awareness."  This means understanding the normal appearance and feel of your breasts and may include breast self-examination.  Any changes detected, no matter how small, should be reported to a health care provider.  Women in their 80s and 30s should have a clinical breast exam (CBE) by a health care provider as part of a regular health exam every 1 to 3 years.  After age 66, women should have a CBE every year.  Starting at age 1, women should consider having a mammogram (breast X-ray test) every year.  Women who have a family history of breast cancer should talk to their health care provider about genetic screening.  Women at a high risk of breast cancer should talk to their health care providers about having an MRI and a mammogram every year.   -Breast cancer gene (BRCA)-related cancer risk assessment is recommended for women who have family members with BRCA-related cancers. BRCA-related cancers include breast, ovarian, tubal, and peritoneal cancers. Having family members with these cancers may be associated with an increased risk for harmful changes (mutations) in the breast cancer genes BRCA1 and  BRCA2. Results of the assessment will determine the need for genetic counseling and BRCA1 and BRCA2 testing.   The Pap test is a screening test for cervical cancer. A Pap test can show cell changes on the cervix that might become cervical cancer if left untreated. A Pap test is a procedure in which cells are obtained and examined from the lower end of the uterus (cervix).   - Women should have a Pap test starting at age 57.   - Between ages 90 and 70, Pap tests should be repeated every 2 years.   - Beginning at age 63, you should have a Pap test every 3 years as long as the past 3 Pap tests have been normal.   - Some women have medical problems that increase the chance of getting cervical cancer. Talk to your health care provider about these problems. It is especially important to talk to your health care provider if a  new problem develops soon after your last Pap test. In these cases, your health care provider may recommend more frequent screening and Pap tests.   - The above recommendations are the same for women who have or have not gotten the vaccine for human papillomavirus (HPV).   - If you had a hysterectomy for a problem that was not cancer or a condition that could lead to cancer, then you no longer need Pap tests. Even if you no longer need a Pap test, a regular exam is a good idea to make sure no other problems are starting.   - If you are between ages 36 and 66 years, and you have had normal Pap tests going back 10 years, you no longer need Pap tests. Even if you no longer need a Pap test, a regular exam is a good idea to make sure no other problems are starting.   - If you have had past treatment for cervical cancer or a condition that could lead to cancer, you need Pap tests and screening for cancer for at least 20 years after your treatment.   - If Pap tests have been discontinued, risk factors (such as a new sexual partner) need to be reassessed to determine if screening should  be resumed.   - The HPV test is an additional test that may be used for cervical cancer screening. The HPV test looks for the virus that can cause the cell changes on the cervix. The cells collected during the Pap test can be tested for HPV. The HPV test could be used to screen women aged 70 years and older, and should be used in women of any age who have unclear Pap test results. After the age of 67, women should have HPV testing at the same frequency as a Pap test.   Colorectal cancer can be detected and often prevented. Most routine colorectal cancer screening begins at the age of 57 years and continues through age 26 years. However, your health care provider may recommend screening at an earlier age if you have risk factors for colon cancer. On a yearly basis, your health care provider may provide home test kits to check for hidden blood in the stool.  Use of a small camera at the end of a tube, to directly examine the colon (sigmoidoscopy or colonoscopy), can detect the earliest forms of colorectal cancer. Talk to your health care provider about this at age 23, when routine screening begins. Direct exam of the colon should be repeated every 5 -10 years through age 49 years, unless early forms of pre-cancerous polyps or small growths are found.   People who are at an increased risk for hepatitis B should be screened for this virus. You are considered at high risk for hepatitis B if:  -You were born in a country where hepatitis B occurs often. Talk with your health care provider about which countries are considered high risk.  - Your parents were born in a high-risk country and you have not received a shot to protect against hepatitis B (hepatitis B vaccine).  - You have HIV or AIDS.  - You use needles to inject street drugs.  - You live with, or have sex with, someone who has Hepatitis B.  - You get hemodialysis treatment.  - You take certain medicines for conditions like cancer, organ  transplantation, and autoimmune conditions.   Hepatitis C blood testing is recommended for all people born from [birth year] through 1965 and any individual  with known risks for hepatitis C.   Practice safe sex. Use condoms and avoid high-risk sexual practices to reduce the spread of sexually transmitted infections (STIs). STIs include gonorrhea, chlamydia, syphilis, trichomonas, herpes, HPV, and human immunodeficiency virus (HIV). Herpes, HIV, and HPV are viral illnesses that have no cure. They can result in disability, cancer, and death. Sexually active women aged 25 years and younger should be checked for chlamydia. Older women with new or multiple partners should also be tested for chlamydia. Testing for other STIs is recommended if you are sexually active and at increased risk.   Osteoporosis is a disease in which the bones lose minerals and strength with aging. This can result in serious bone fractures or breaks. The risk of osteoporosis can be identified using a bone density scan. Women ages 65 years and over and women at risk for fractures or osteoporosis should discuss screening with their health care providers. Ask your health care provider whether you should take a calcium supplement or vitamin D to There are also several preventive steps women can take to avoid osteoporosis and resulting fractures or to keep osteoporosis from worsening. -->Recommendations include:  Eat a balanced diet high in fruits, vegetables, calcium, and vitamins.  Get enough calcium. The recommended total intake of is 1,200 mg daily; for best absorption, if taking supplements, divide doses into 250-500 mg doses throughout the day. Of the two types of calcium, calcium carbonate is best absorbed when taken with food but calcium citrate can be taken on an empty stomach.  Get enough vitamin D. NAMS and the National Osteoporosis Foundation recommend at least 1,000 IU per day for women age 50 and over who are at risk of vitamin D  deficiency. Vitamin D deficiency can be caused by inadequate sun exposure (for example, those who live in northern latitudes).  Avoid alcohol and smoking. Heavy alcohol intake (more than 7 drinks per week) increases the risk of falls and hip fracture and women smokers tend to lose bone more rapidly and have lower bone mass than nonsmokers. Stopping smoking is one of the most important changes women can make to improve their health and decrease risk for disease.  Be physically active every day. Weight-bearing exercise (for example, fast walking, hiking, jogging, and weight training) may strengthen bones or slow the rate of bone loss that comes with aging. Balancing and muscle-strengthening exercises can reduce the risk of falling and fracture.  Consider therapeutic medications. Currently, several types of effective drugs are available. Healthcare providers can recommend the type most appropriate for each woman.  Eliminate environmental factors that may contribute to accidents. Falls cause nearly 90% of all osteoporotic fractures, so reducing this risk is an important bone-health strategy. Measures include ample lighting, removing obstructions to walking, using nonskid rugs on floors, and placing mats and/or grab bars in showers.  Be aware of medication side effects. Some common medicines make bones weaker. These include a type of steroid drug called glucocorticoids used for arthritis and asthma, some antiseizure drugs, certain sleeping pills, treatments for endometriosis, and some cancer drugs. An overactive thyroid gland or using too much thyroid hormone for an underactive thyroid can also be a problem. If you are taking these medicines, talk to your doctor about what you can do to help protect your bones.reduce the rate of osteoporosis.    Menopause can be associated with physical symptoms and risks. Hormone replacement therapy is available to decrease symptoms and risks. You should talk to your  health care provider   about whether hormone replacement therapy is right for you.   Use sunscreen. Apply sunscreen liberally and repeatedly throughout the day. You should seek shade when your shadow is shorter than you. Protect yourself by wearing long sleeves, pants, a wide-brimmed hat, and sunglasses year round, whenever you are outdoors.   Once a month, do a whole body skin exam, using a mirror to look at the skin on your back. Tell your health care provider of new moles, moles that have irregular borders, moles that are larger than a pencil eraser, or moles that have changed in shape or color.   -Stay current with required vaccines (immunizations).   Influenza vaccine. All adults should be immunized every year.  Tetanus, diphtheria, and acellular pertussis (Td, Tdap) vaccine. Pregnant women should receive 1 dose of Tdap vaccine during each pregnancy. The dose should be obtained regardless of the length of time since the last dose. Immunization is preferred during the 27th 36th week of gestation. An adult who has not previously received Tdap or who does not know her vaccine status should receive 1 dose of Tdap. This initial dose should be followed by tetanus and diphtheria toxoids (Td) booster doses every 10 years. Adults with an unknown or incomplete history of completing a 3-dose immunization series with Td-containing vaccines should begin or complete a primary immunization series including a Tdap dose. Adults should receive a Td booster every 10 years.  Varicella vaccine. An adult without evidence of immunity to varicella should receive 2 doses or a second dose if she has previously received 1 dose. Pregnant females who do not have evidence of immunity should receive the first dose after pregnancy. This first dose should be obtained before leaving the health care facility. The second dose should be obtained 4 8 weeks after the first dose.  Human papillomavirus (HPV) vaccine. Females aged 13 26  years who have not received the vaccine previously should obtain the 3-dose series. The vaccine is not recommended for use in pregnant females. However, pregnancy testing is not needed before receiving a dose. If a female is found to be pregnant after receiving a dose, no treatment is needed. In that case, the remaining doses should be delayed until after the pregnancy. Immunization is recommended for any person with an immunocompromised condition through the age of 26 years if she did not get any or all doses earlier. During the 3-dose series, the second dose should be obtained 4 8 weeks after the first dose. The third dose should be obtained 24 weeks after the first dose and 16 weeks after the second dose.  Zoster vaccine. One dose is recommended for adults aged 60 years or older unless certain conditions are present.  Measles, mumps, and rubella (MMR) vaccine. Adults born before 1957 generally are considered immune to measles and mumps. Adults born in 1957 or later should have 1 or more doses of MMR vaccine unless there is a contraindication to the vaccine or there is laboratory evidence of immunity to each of the three diseases. A routine second dose of MMR vaccine should be obtained at least 28 days after the first dose for students attending postsecondary schools, health care workers, or international travelers. People who received inactivated measles vaccine or an unknown type of measles vaccine during 1963 1967 should receive 2 doses of MMR vaccine. People who received inactivated mumps vaccine or an unknown type of mumps vaccine before 1979 and are at high risk for mumps infection should consider immunization with 2 doses of   MMR vaccine. For females of childbearing age, rubella immunity should be determined. If there is no evidence of immunity, females who are not pregnant should be vaccinated. If there is no evidence of immunity, females who are pregnant should delay immunization until after pregnancy.  Unvaccinated health care workers born before [birth year] who lack laboratory evidence of measles, mumps, or rubella immunity or laboratory confirmation of disease should consider measles and mumps immunization with 2 doses of MMR vaccine or rubella immunization with 1 dose of MMR vaccine.  Pneumococcal 13-valent conjugate (PCV13) vaccine. When indicated, a person who is uncertain of her immunization history and has no record of immunization should receive the PCV13 vaccine. An adult aged 54 years or older who has certain medical conditions and has not been previously immunized should receive 1 dose of PCV13 vaccine. This PCV13 should be followed with a dose of pneumococcal polysaccharide (PPSV23) vaccine. The PPSV23 vaccine dose should be obtained at least 8 weeks after the dose of PCV13 vaccine. An adult aged 58 years or older who has certain medical conditions and previously received 1 or more doses of PPSV23 vaccine should receive 1 dose of PCV13. The PCV13 vaccine dose should be obtained 1 or more years after the last PPSV23 vaccine dose.  Pneumococcal polysaccharide (PPSV23) vaccine. When PCV13 is also indicated, PCV13 should be obtained first. All adults aged 58 years and older should be immunized. An adult younger than age 65 years who has certain medical conditions should be immunized. Any person who resides in a nursing home or long-term care facility should be immunized. An adult smoker should be immunized. People with an immunocompromised condition and certain other conditions should receive both PCV13 and PPSV23 vaccines. People with human immunodeficiency virus (HIV) infection should be immunized as soon as possible after diagnosis. Immunization during chemotherapy or radiation therapy should be avoided. Routine use of PPSV23 vaccine is not recommended for American Indians, [place] Natives, or people younger than 65 years unless there are medical conditions that require PPSV23 vaccine. When indicated,  people who have unknown immunization and have no record of immunization should receive PPSV23 vaccine. One-time revaccination 5 years after the first dose of PPSV23 is recommended for people aged 70 64 years who have chronic kidney failure, nephrotic syndrome, asplenia, or immunocompromised conditions. People who received 1 2 doses of PPSV23 before age 32 years should receive another dose of PPSV23 vaccine at age 96 years or later if at least 5 years have passed since the previous dose. Doses of PPSV23 are not needed for people immunized with PPSV23 at or after age 55 years.  Meningococcal vaccine. Adults with asplenia or persistent complement component deficiencies should receive 2 doses of quadrivalent meningococcal conjugate (MenACWY-D) vaccine. The doses should be obtained at least 2 months apart. Microbiologists working with certain meningococcal bacteria, [employer] recruits, people at risk during an outbreak, and people who travel to or live in countries with a high rate of meningitis should be immunized. A first-year college student up through age 58 years who is living in a residence hall should receive a dose if she did not receive a dose on or after her 16th birthday. Adults who have certain high-risk conditions should receive one or more doses of vaccine.  Hepatitis A vaccine. Adults who wish to be protected from this disease, have certain high-risk conditions, work with hepatitis A-infected animals, work in hepatitis A research labs, or travel to or work in countries with a high rate of hepatitis A should be  immunized. Adults who were previously unvaccinated and who anticipate close contact with an international adoptee during the first 60 days after arrival in the [place] from a country with a high rate of hepatitis A should be immunized.  Hepatitis B vaccine.  Adults who wish to be protected from this disease, have certain high-risk conditions, may be exposed to blood or other infectious  body fluids, are household contacts or sex partners of hepatitis B positive people, are clients or workers in certain care facilities, or travel to or work in countries with a high rate of hepatitis B should be immunized.  Haemophilus influenzae type b (Hib) vaccine. A previously unvaccinated person with asplenia or sickle cell disease or having a scheduled splenectomy should receive 1 dose of Hib vaccine. Regardless of previous immunization, a recipient of a hematopoietic stem cell transplant should receive a 3-dose series 6 12 months after her successful transplant. Hib vaccine is not recommended for adults with HIV infection.  Preventive Services / Frequency Ages 6 to 39years  Blood pressure check.** / Every 1 to 2 years.  Lipid and cholesterol check.** / Every 5 years beginning at age 39.  Clinical breast exam.** / Every 3 years for women in their 61s and 62s.  BRCA-related cancer risk assessment.** / For women who have family members with a BRCA-related cancer (breast, ovarian, tubal, or peritoneal cancers).  Pap test.** / Every 2 years from ages 47 through 85. Every 3 years starting at age 34 through age 12 or 74 with a history of 3 consecutive normal Pap tests.  HPV screening.** / Every 3 years from ages 46 through ages 43 to 54 with a history of 3 consecutive normal Pap tests.  Hepatitis C blood test.** / For any individual with known risks for hepatitis C.  Skin self-exam. / Monthly.  Influenza vaccine. / Every year.  Tetanus, diphtheria, and acellular pertussis (Tdap, Td) vaccine.** / Consult your health care provider. Pregnant women should receive 1 dose of Tdap vaccine during each pregnancy. 1 dose of Td every 10 years.  Varicella vaccine.** / Consult your health care provider. Pregnant females who do not have evidence of immunity should receive the first dose after pregnancy.  HPV vaccine. / 3 doses over 6 months, if 64 and younger. The vaccine is not recommended for use in  pregnant females. However, pregnancy testing is not needed before receiving a dose.  Measles, mumps, rubella (MMR) vaccine.** / You need at least 1 dose of MMR if you were born in 1957 or later. You may also need a 2nd dose. For females of childbearing age, rubella immunity should be determined. If there is no evidence of immunity, females who are not pregnant should be vaccinated. If there is no evidence of immunity, females who are pregnant should delay immunization until after pregnancy.  Pneumococcal 13-valent conjugate (PCV13) vaccine.** / Consult your health care provider.  Pneumococcal polysaccharide (PPSV23) vaccine.** / 1 to 2 doses if you smoke cigarettes or if you have certain conditions.  Meningococcal vaccine.** / 1 dose if you are age 71 to 36 years and a Market researcher living in a residence hall, or have one of several medical conditions, you need to get vaccinated against meningococcal disease. You may also need additional booster doses.  Hepatitis A vaccine.** / Consult your health care provider.  Hepatitis B vaccine.** / Consult your health care provider.  Haemophilus influenzae type b (Hib) vaccine.** / Consult your health care provider.  Ages 55 to 64years  Blood pressure check.** / Every 1 to 2 years.  Lipid and cholesterol check.** / Every 5 years beginning at age 20 years.  Lung cancer screening. / Every year if you are aged 55 80 years and have a 30-pack-year history of smoking and currently smoke or have quit within the past 15 years. Yearly screening is stopped once you have quit smoking for at least 15 years or develop a health problem that would prevent you from having lung cancer treatment.  Clinical breast exam.** / Every year after age 40 years.  BRCA-related cancer risk assessment.** / For women who have family members with a BRCA-related cancer (breast, ovarian, tubal, or peritoneal cancers).  Mammogram.** / Every year beginning at age 40  years and continuing for as long as you are in good health. Consult with your health care provider.  Pap test.** / Every 3 years starting at age 30 years through age 65 or 70 years with a history of 3 consecutive normal Pap tests.  HPV screening.** / Every 3 years from ages 30 years through ages 65 to 70 years with a history of 3 consecutive normal Pap tests.  Fecal occult blood test (FOBT) of stool. / Every year beginning at age 50 years and continuing until age 75 years. You may not need to do this test if you get a colonoscopy every 10 years.  Flexible sigmoidoscopy or colonoscopy.** / Every 5 years for a flexible sigmoidoscopy or every 10 years for a colonoscopy beginning at age 50 years and continuing until age 75 years.  Hepatitis C blood test.** / For all people born from 1945 through 1965 and any individual with known risks for hepatitis C.  Skin self-exam. / Monthly.  Influenza vaccine. / Every year.  Tetanus, diphtheria, and acellular pertussis (Tdap/Td) vaccine.** / Consult your health care provider. Pregnant women should receive 1 dose of Tdap vaccine during each pregnancy. 1 dose of Td every 10 years.  Varicella vaccine.** / Consult your health care provider. Pregnant females who do not have evidence of immunity should receive the first dose after pregnancy.  Zoster vaccine.** / 1 dose for adults aged 60 years or older.  Measles, mumps, rubella (MMR) vaccine.** / You need at least 1 dose of MMR if you were born in 1957 or later. You may also need a 2nd dose. For females of childbearing age, rubella immunity should be determined. If there is no evidence of immunity, females who are not pregnant should be vaccinated. If there is no evidence of immunity, females who are pregnant should delay immunization until after pregnancy.  Pneumococcal 13-valent conjugate (PCV13) vaccine.** / Consult your health care provider.  Pneumococcal polysaccharide (PPSV23) vaccine.** / 1 to 2 doses if  you smoke cigarettes or if you have certain conditions.  Meningococcal vaccine.** / Consult your health care provider.  Hepatitis A vaccine.** / Consult your health care provider.  Hepatitis B vaccine.** / Consult your health care provider.  Haemophilus influenzae type b (Hib) vaccine.** / Consult your health care provider.  Ages 65 years and over  Blood pressure check.** / Every 1 to 2 years.  Lipid and cholesterol check.** / Every 5 years beginning at age 20 years.  Lung cancer screening. / Every year if you are aged 55 80 years and have a 30-pack-year history of smoking and currently smoke or have quit within the past 15 years. Yearly screening is stopped once you have quit smoking for at least 15 years or develop a health problem that   would prevent you from having lung cancer treatment.  Clinical breast exam.** / Every year after age 103 years.  BRCA-related cancer risk assessment.** / For women who have family members with a BRCA-related cancer (breast, ovarian, tubal, or peritoneal cancers).  Mammogram.** / Every year beginning at age 36 years and continuing for as long as you are in good health. Consult with your health care provider.  Pap test.** / Every 3 years starting at age 5 years through age 85 or 10 years with 3 consecutive normal Pap tests. Testing can be stopped between 65 and 70 years with 3 consecutive normal Pap tests and no abnormal Pap or HPV tests in the past 10 years.  HPV screening.** / Every 3 years from ages 93 years through ages 70 or 45 years with a history of 3 consecutive normal Pap tests. Testing can be stopped between 65 and 70 years with 3 consecutive normal Pap tests and no abnormal Pap or HPV tests in the past 10 years.  Fecal occult blood test (FOBT) of stool. / Every year beginning at age 8 years and continuing until age 45 years. You may not need to do this test if you get a colonoscopy every 10 years.  Flexible sigmoidoscopy or colonoscopy.** /  Every 5 years for a flexible sigmoidoscopy or every 10 years for a colonoscopy beginning at age 69 years and continuing until age 68 years.  Hepatitis C blood test.** / For all people born from [birth year] through 1966 and any individual with known risks for hepatitis C.  Osteoporosis screening.** / A one-time screening for women ages 7 years and over and women at risk for fractures or osteoporosis.  Skin self-exam. / Monthly.  Influenza vaccine. / Every year.  Tetanus, diphtheria, and acellular pertussis (Tdap/Td) vaccine.** / 1 dose of Td every 10 years.  Varicella vaccine.** / Consult your health care provider.  Zoster vaccine.** / 1 dose for adults aged 5 years or older.  Pneumococcal 13-valent conjugate (PCV13) vaccine.** / Consult your health care provider.  Pneumococcal polysaccharide (PPSV23) vaccine.** / 1 dose for all adults aged 74 years and older.  Meningococcal vaccine.** / Consult your health care provider.  Hepatitis A vaccine.** / Consult your health care provider.  Hepatitis B vaccine.** / Consult your health care provider.  Haemophilus influenzae type b (Hib) vaccine.** / Consult your health care provider. ** Family history and personal history of risk and conditions may change your health care provider's recommendations. Document Released: 04/30/2001 Document Revised: 12/23/2012  [facility] Patient Information 2014 [place], [place].   EXERCISE AND DIET:  We recommended that you start or continue a regular exercise program for good health. Regular exercise means any activity that makes your heart beat faster and makes you sweat.  We recommend exercising at least 30 minutes per day at least 3 days a week, preferably 5.  We also recommend a diet low in fat and sugar / carbohydrates.  Inactivity, poor dietary choices and obesity can cause diabetes, heart attack, stroke, and kidney damage, among others.     ALCOHOL AND SMOKING:  Women should limit their alcohol intake to no  more than 7 drinks/beers/glasses of wine (combined, not each!) per week. Moderation of alcohol intake to this level decreases your risk of breast cancer and liver damage.  ( And of course, no recreational drugs are part of a healthy lifestyle.)  Also, you should not be smoking at all or even being exposed to second hand smoke. Most people know smoking can  cause cancer, and various heart and lung diseases, but did you know it also contributes to weakening of your bones?  Aging of your skin?  Yellowing of your teeth and nails?   CALCIUM AND VITAMIN D:  Adequate intake of calcium and Vitamin D are recommended.  The recommendations for exact amounts of these supplements seem to change often, but generally speaking 600 mg of calcium (either carbonate or citrate) and 800 units of Vitamin D per day seems prudent. Certain women may benefit from higher intake of Vitamin D.  If you are among these women, your doctor will have told you during your visit.     PAP SMEARS:  Pap smears, to check for cervical cancer or precancers,  have traditionally been done yearly, although recent scientific advances have shown that most women can have pap smears less often.  However, every woman still should have a physical exam from her gynecologist or primary care physician every year. It will include a breast check, inspection of the vulva and vagina to check for abnormal growths or skin changes, a visual exam of the cervix, and then an exam to evaluate the size and shape of the uterus and ovaries.  And after 63 years of age, a rectal exam is indicated to check for rectal cancers. We will also provide age appropriate advice regarding health maintenance, like when you should have certain vaccines, screening for sexually transmitted diseases, bone density testing, colonoscopy, mammograms, etc.    MAMMOGRAMS:  All women over 71 years old should have a yearly mammogram. Many facilities now offer a "3D" mammogram, which may cost  around $50 extra out of pocket. If possible,  we recommend you accept the option to have the 3D mammogram performed.  It both reduces the number of women who will be called back for extra views which then turn out to be normal, and it is better than the routine mammogram at detecting truly abnormal areas.     COLONOSCOPY:  Colonoscopy to screen for colon cancer is recommended for all women at age 52.  We know, you hate the idea of the prep.  We agree, BUT, having colon cancer and not knowing it is worse!!  Colon cancer so often starts as a polyp that can be seen and removed at colonscopy, which can quite literally save your life!  And if your first colonoscopy is normal and you have no family history of colon cancer, most women don't have to have it again for 10 years.  Once every ten years, you can do something that may end up saving your life, right?  We will be happy to help you get it scheduled when you are ready.  Be sure to check your insurance coverage so you understand how much it will cost.  It may be covered as a preventative service at no cost, but you should check your particular policy.

## 2017-07-09 LAB — LIPID PANEL
CHOLESTEROL TOTAL: 232 mg/dL — AB (ref 100–199)
Chol/HDL Ratio: 4.2 ratio (ref 0.0–4.4)
HDL: 55 mg/dL (ref >39)
LDL Calculated: 160 mg/dL — ABNORMAL HIGH (ref 0–99)
Triglycerides: 87 mg/dL (ref 0–149)
VLDL CHOLESTEROL CAL: 17 mg/dL (ref 5–40)

## 2017-07-09 LAB — COMPREHENSIVE METABOLIC PANEL
ALT: 12 IU/L (ref 0–32)
AST: 12 IU/L (ref 0–40)
Albumin/Globulin Ratio: 1.9 (ref 1.2–2.2)
Albumin: 4.6 g/dL (ref 3.6–4.8)
Alkaline Phosphatase: 73 IU/L (ref 39–117)
BUN/Creatinine Ratio: 16 (ref 12–28)
BUN: 13 mg/dL (ref 8–27)
Bilirubin Total: 0.4 mg/dL (ref 0.0–1.2)
CALCIUM: 9.8 mg/dL (ref 8.7–10.3)
CO2: 25 mmol/L (ref 20–29)
Chloride: 101 mmol/L (ref 96–106)
Creatinine, Ser: 0.82 mg/dL (ref 0.57–1.00)
GFR, EST AFRICAN AMERICAN: 89 mL/min/{1.73_m2} (ref >59)
GFR, EST NON AFRICAN AMERICAN: 77 mL/min/{1.73_m2} (ref >59)
GLUCOSE: 89 mg/dL (ref 65–99)
Globulin, Total: 2.4 g/dL (ref 1.5–4.5)
Potassium: 5.2 mmol/L (ref 3.5–5.2)
Sodium: 142 mmol/L (ref 134–144)
TOTAL PROTEIN: 7 g/dL (ref 6.0–8.5)

## 2017-07-09 LAB — HEMOGLOBIN A1C
ESTIMATED AVERAGE GLUCOSE: 114 mg/dL
Hgb A1c MFr Bld: 5.6 % (ref 4.8–5.6)

## 2017-07-21 ENCOUNTER — Other Ambulatory Visit: Payer: Self-pay

## 2017-07-21 MED ORDER — AMLODIPINE BESY-BENAZEPRIL HCL 2.5-10 MG PO CAPS: 1.0000 | ORAL_CAPSULE | Freq: Every day | ORAL | 1 refills | Status: DC

## 2017-07-21 NOTE — Telephone Encounter (Signed)
Pharmacy sent refill request for Amlodipine.  Reviewed chart - medication was last filled by a pervious provider.  Patient was last seen in the office 07/08/2017.  Sent request to Dr. Raliegh Scarlet to review. MPulliam, CMA/RT(R)

## 2017-07-29 ENCOUNTER — Telehealth: Payer: Self-pay | Admitting: Family Medicine

## 2017-07-29 NOTE — Telephone Encounter (Signed)
Patient called states she pulled a tick off upper back of thigh (panty line around leg) area is now red & swollen.  --Pt wants to know if she should be examined for Lyme disease.  ---Forwarding message to medical assistant to advise patient at phone# [phone].

## 2017-07-30 NOTE — Telephone Encounter (Signed)
Called patient - appointment made for tomorrow. MPulliam, CMA/RT(R)

## 2017-07-30 NOTE — Telephone Encounter (Signed)
Called patient, no answer and no voicemail. MPulliam, CMA/RT(R)

## 2017-07-31 ENCOUNTER — Ambulatory Visit (INDEPENDENT_AMBULATORY_CARE_PROVIDER_SITE_OTHER): Payer: BC Managed Care – PPO | Admitting: Family Medicine

## 2017-07-31 ENCOUNTER — Encounter: Payer: Self-pay | Admitting: Family Medicine

## 2017-07-31 VITALS — BP 138/92 | HR 71 | Ht 71.0 in | Wt 169.0 lb

## 2017-07-31 DIAGNOSIS — W57XXXA Bitten or stung by nonvenomous insect and other nonvenomous arthropods, initial encounter: Secondary | ICD-10-CM

## 2017-07-31 DIAGNOSIS — R238 Other skin changes: Secondary | ICD-10-CM

## 2017-07-31 MED ORDER — HYDROCORTISONE 1 % EX LOTN: 1.0000 "application " | TOPICAL_LOTION | Freq: Four times a day (QID) | CUTANEOUS | 0 refills | Status: DC

## 2017-07-31 NOTE — Patient Instructions (Addendum)
Ms. Savannah Mendez, I welcome you to come in as often as you need to so I can address each and every 1 of your concerns.   Insect Bite, Adult An insect bite can make your skin red, itchy, and swollen. An insect bite is different from an insect sting, which happens when an insect injects poison (venom) into the skin. Some insects can spread disease to people through a bite. However, most insect bites do not lead to disease and are not serious. What are the causes? Insects may bite for a variety of reasons, including:  Hunger.  To defend themselves.  Insects that bite include:  Spiders.  Mosquitoes.  Ticks.  Fleas.  Ants.  Flies.  Bedbugs.  What are the signs or symptoms? Symptoms of this condition include:  Itching or pain in the bite area.  Redness and swelling in the bite area.  An open wound (skin ulcer).  In many cases, symptoms last for 2-4 days. How is this diagnosed? This condition is usually diagnosed based on symptoms and a physical exam. How is this treated? Treatment is usually not needed. Symptoms often go away on their own. When treatment is recommended, it may involve:  Applying a cream or lotion to the bitten area. This treatment helps with itching.  Taking an antibiotic medicine. This treatment is needed if the bite area gets infected.  Getting a tetanus shot.  Applying ice to the affected area.  Medicines called antihistamines. This treatment is needed if you develop an allergic reaction to the insect bite.  Follow these instructions at home: Bite area care  Do not scratch the bite area.  Keep the bite area clean and dry. Wash it every day with soap and water as told by your health care provider.  Check the bite area every day for signs of infection. Check for: ? More redness, swelling, or pain. ? Fluid or blood. ? Warmth. ? Pus. Managing pain, itching, and swelling   You may apply a baking soda paste, cortisone cream, or calamine lotion to  the bite area as told by your health care provider.  If directed, applyice to the bite area. ? Put ice in a plastic bag. ? Place a towel between your skin and the bag. ? Leave the ice on for 20 minutes, 2-3 times per day. Medicines  Apply or take over-the-counter and prescription medicines only as told by your health care provider.  If you were prescribed an antibiotic medicine, use it as told by your health care provider. Do not stop using the antibiotic even if your condition improves. General instructions  Keep all follow-up visits as told by your health care provider. This is important. How is this prevented? To help reduce your risk of insect bites:  When you are outdoors, wear clothing that covers your arms and legs.  Use insect repellent. The best insect repellents contain: ? DEET, picaridin, oil of lemon eucalyptus (OLE), or IR3535. ? Higher amounts of an active ingredient.  If your home windows do not have screens, consider installing them.  Contact a health care provider if:  You have more redness, swelling, or pain in the bite area.  You have fluid, blood, or pus coming from the bite area.  The bite area feels warm to the touch.  You have a fever. Get help right away if:  You have joint pain.  You have a rash.  You have shortness of breath.  You feel unusually tired or sleepy.  You have neck  pain.  You have a headache.  You have unusual weakness.  You have chest pain.  You have nausea, vomiting, or pain in the abdomen. This information is not intended to replace advice given to you by your health care provider. Make sure you discuss any questions you have with your health care provider. Document Released: 04/11/2004 Document Revised: 11/01/2015 Document Reviewed: 09/11/2015 Elsevier Interactive Patient Education  [employer].

## 2017-07-31 NOTE — Progress Notes (Signed)
Pt here for an acute care OV today   Impression and Recommendations:    1. Tick bite, initial encounter   2. Skin irritation     1. Tick bite, initial encounter/skin irritaiton -Keep the area clean and dried. Wear loose-fitting clothes and cover the area with a bandage. Avoid applying friction or rubbing the area.  -use 0.1% hydrocortisone cream OTC, 3-4 times a day.    Meds ordered this encounter  Medications  . hydrocortisone 1 % lotion    Sig: Apply 1 application topically 4 (four) times daily. Use over-the-counter cream    Dispense:  118 mL    Refill:  0    No orders of the defined types were placed in this encounter.    Education and routine counseling performed. Handouts provided  Gross side effects, risk and benefits, and alternatives of medications and treatment plan in general discussed with patient.  Patient is aware that all medications have potential side effects and we are unable to predict every side effect or drug-drug interaction that may occur.   Patient will call with any questions prior to using medication if they have concerns.  Expresses verbal understanding and consents to current therapy and treatment regimen.  No barriers to understanding were identified.  Red flag symptoms and signs discussed in detail.  Patient expressed understanding regarding what to do in case of emergency\urgent symptoms   Please see AVS handed out to patient at the end of our visit for further patient instructions/ counseling done pertaining to today's office visit.   Return for Follow-up for chronic care as previously discussed and as needed..     Note: This document was prepared occasionally using Dragon voice recognition software and may include unintentional dictation errors in addition to a scribe.   This document serves as a record of services personally performed by Mellody Dance, DO. It was created on her behalf by Mayer Masker, a trained medical scribe. The  creation of this record is based on the scribe's personal observations and the provider's statements to them.   I have reviewed the above medical documentation for accuracy and completeness and I concur.  Mellody Dance 08/01/17 10:03 AM   --------------------------------------------------------------------------------------------------------------------------------------------------------------------------------------------------------------------------------------------    Subjective:    CC:  Chief Complaint  Patient presents with  . Tick Removal    HPI: Savannah Mendez is a 62 y.o. female who presents to [place] [facility] today for issues as discussed below.  She complains of pain on a raised area of the R side of her buttock that was due to a tick bite. She discovered this 3 days ago. She states last week, 4-5 days ago, she was trimming her bushes/trees in her yard when she noticed a tick on the panty line of her R buttocks. She used alcohol and tweezers to pick the tick off.   She denies fever, chills, HA, or myalgias.  No problems updated.   Wt Readings from Last 3 Encounters:  07/31/17 169 lb (76.7 kg)  07/08/17 168 lb 14.4 oz (76.6 kg)  03/27/17 173 lb (78.5 kg)   BP Readings from Last 3 Encounters:  07/31/17 (!) 138/92  07/08/17 138/86  03/27/17 126/88   BMI Readings from Last 3 Encounters:  07/31/17 23.57 kg/m  07/08/17 23.56 kg/m  03/27/17 24.47 kg/m     Patient Care Team    Relationship Specialty Notifications Start End  Mellody Dance, DO PCP - General Family Medicine  02/04/17  Jari Pigg, MD Consulting Physician Dermatology  02/04/17   Marygrace Drought, MD Consulting Physician Ophthalmology  02/04/17   Nelwyn Salisbury, [place] Physician Assistant Obstetrics and Gynecology  02/10/17   Richmond Campbell, MD Consulting Physician Gastroenterology  07/08/17      Patient Active Problem List   Diagnosis Date Noted  . Glucose  intolerance (impaired glucose tolerance) 03/27/2017  . Statin intolerance- only able to tolerate small dose 03/27/2017  . Chronic pain of right knee 03/27/2017  . Hypertension 02/04/2017  . HLD (hyperlipidemia) 02/04/2017  . Adjustment disorder with mixed anxiety and depressed mood 02/04/2017  . Postmenopausal- 2008 02/04/2017  . S/P colonoscopy- 2010- N 02/04/2017  . h/o Thyroid nodule- R s/p Bx 2016 or so 02/04/2017    Past Medical history, Surgical history, Family history, Social history, Allergies and Medications have been entered into the medical record, reviewed and changed as needed.    Current Meds  Medication Sig  . amlodipine-benazepril (LOTREL) 2.5-10 MG capsule Take 1 capsule by mouth daily.  . Cholecalciferol (VITAMIN D3) 5000 units CAPS Take 1 capsule by mouth daily.  . Coenzyme Q10 (COQ10 PO) Take 1 capsule by mouth daily.  . Omega 3 1200 MG CAPS Take 1 capsule by mouth daily.  . rosuvastatin (CRESTOR) 10 MG tablet Take 10 mg by mouth daily.    Allergies:  Allergies  Allergen Reactions  . Paxil [Paroxetine]      Review of Systems: General:   Denies fever, chills, unexplained weight loss.  Optho/Auditory:   Denies visual changes, blurred vision/LOV Respiratory:   Denies wheeze, DOE more than baseline levels.  Cardiovascular:   Denies chest pain, palpitations, new onset peripheral edema  Gastrointestinal:   Denies nausea, vomiting, diarrhea, abd pain.  Genitourinary: Denies dysuria, freq/ urgency, flank pain or discharge from genitals.  Endocrine:     Denies hot or cold intolerance, polyuria, polydipsia. Musculoskeletal:   Denies unexplained myalgias, joint swelling, unexplained arthralgias, gait problems.  Skin:  Denies new onset rash, suspicious lesions Neurological:     Denies dizziness, unexplained weakness, numbness  Psychiatric/Behavioral:   Denies mood changes, suicidal or homicidal ideations, hallucinations    Objective:   Blood pressure (!) 138/92,  pulse 71, height 5\' 11"  (1.803 m), weight 169 lb (76.7 kg), SpO2 98 %. Body mass index is 23.57 kg/m. General:  Well Developed, well nourished, appropriate for stated age.  Neuro:  Alert and oriented,  extra-ocular muscles intact  HEENT:  Normocephalic, atraumatic, neck supple Skin:  no gross rash, warm, pink.  On R buttock  Erythematous papule that is desurfaced, approx 0.2 mm diameter with surrounding raised area of erythema approximately 0.3 mm in diameter. No bulls eye lesion, no central clearing.   Cardiac:  RRR, S1 S2 Respiratory:  ECTA B/L and A/P, Not using accessory muscles, speaking in full sentences- unlabored. Vascular:  Ext warm, no cyanosis apprec.; cap RF less 2 sec. Psych:  No HI/SI, judgement and insight good, Euthymic mood. Full Affect.

## 2017-09-25 ENCOUNTER — Other Ambulatory Visit: Payer: BC Managed Care – PPO

## 2017-10-02 ENCOUNTER — Encounter: Payer: Self-pay | Admitting: Family Medicine

## 2017-10-02 ENCOUNTER — Ambulatory Visit (INDEPENDENT_AMBULATORY_CARE_PROVIDER_SITE_OTHER): Payer: BC Managed Care – PPO | Admitting: Family Medicine

## 2017-10-02 VITALS — BP 138/81 | HR 59 | Ht 71.0 in | Wt 166.4 lb

## 2017-10-02 DIAGNOSIS — E782 Mixed hyperlipidemia: Secondary | ICD-10-CM

## 2017-10-02 DIAGNOSIS — Z789 Other specified health status: Secondary | ICD-10-CM | POA: Diagnosis not present

## 2017-10-02 DIAGNOSIS — G8929 Other chronic pain: Secondary | ICD-10-CM

## 2017-10-02 DIAGNOSIS — I10 Essential (primary) hypertension: Secondary | ICD-10-CM | POA: Diagnosis not present

## 2017-10-02 DIAGNOSIS — E041 Nontoxic single thyroid nodule: Secondary | ICD-10-CM

## 2017-10-02 DIAGNOSIS — M25561 Pain in right knee: Secondary | ICD-10-CM

## 2017-10-02 DIAGNOSIS — R7302 Impaired glucose tolerance (oral): Secondary | ICD-10-CM | POA: Diagnosis not present

## 2017-10-02 NOTE — Patient Instructions (Addendum)
Make sure you restart your Crestor preferably nightly or at least once daily.  We will need to recheck your liver enzyme ALT as well as your cholesterol panel in the future    Guidelines for a Low Cholesterol, Low Saturated Fat Diet   Fats - Limit total intake of fats and oils. - Avoid butter, stick margarine, shortening, lard, palm and coconut oils. - Limit mayonnaise, salad dressings, gravies and sauces, unless they are homemade with low-fat ingredients. - Limit chocolate. - Choose low-fat and nonfat products, such as low-fat mayonnaise, low-fat or non-hydrogenated peanut butter, low-fat or fat-free salad dressings and nonfat gravy. - Use vegetable oil, such as canola or olive oil. - Look for margarine that does not contain trans fatty acids. - Use nuts in moderate amounts. - Read ingredient labels carefully to determine both amount and type of fat present in foods. Limit saturated and trans fats! - Avoid high-fat processed and convenience foods.  Meats and Meat Alternatives - Choose fish, chicken, [place] and lean meats. - Use dried beans, peas, lentils and tofu. - Limit egg yolks to three to four per week. - If you eat red meat, limit to no more than three servings per week and choose loin or round cuts. - Avoid fatty meats, such as bacon, sausage, franks, luncheon meats and ribs. - Avoid all organ meats, including liver.  Dairy - Choose nonfat or low-fat milk, yogurt and cottage cheese. - Most cheeses are high in fat. Choose cheeses made from non-fat milk, such as mozzarella and ricotta cheese. - Choose light or fat-free cream cheese and sour cream. - Avoid cream and sauces made with cream.  Fruits and Vegetables - Eat a wide variety of fruits and vegetables. - Use lemon juice, vinegar or "mist" olive oil on vegetables. - Avoid adding sauces, fat or oil to vegetables.  Breads, Cereals and Grains - Choose whole-grain breads, cereals, pastas and rice. - Avoid high-fat  snack foods, such as granola, cookies, pies, pastries, doughnuts and croissants.  Cooking Tips - Avoid deep fried foods. - Trim visible fat off meats and remove skin from poultry before cooking. - Bake, broil, boil, poach or roast poultry, fish and lean meats. - Drain and discard fat that drains out of meat as you cook it. - Add little or no fat to foods. - Use vegetable oil sprays to grease pans for cooking or baking. - Steam vegetables. - Use herbs or no-oil marinades to flavor foods.  Nine ways to increase your "good" HDL cholesterol  High-density lipoprotein (HDL) is often referred to as the "good" cholesterol. Having high HDL levels helps carry cholesterol from your arteries to your liver, where it can be used or excreted.  Having high levels of HDL also has antioxidant and anti-inflammatory effects, and is linked to a reduced risk of heart disease (1, 2).  Most health experts recommend minimum blood levels of 40 mg/dl in men and 50 mg/dl in women.  While genetics definitely play a role, there are several other factors that affect HDL levels.  Here are nine healthy ways to raise your "good" HDL cholesterol.  1. Consume olive oil  two pieces of salmon on a plate olive oil being poured into a small dish Extra virgin olive oil may be more healthful than processed olive oils. Olive oil is one of the healthiest fats around.  A large analysis of 42 studies with more than 800,000 participants found that olive oil was the only source of monounsaturated fat that  seemed to reduce heart disease risk (3).  Research has shown that one of olive oil's heart-healthy effects is an increase in HDL cholesterol. This effect is thought to be caused by antioxidants it contains called polyphenols (4, 5, 6, 7).  Extra virgin olive oil has more polyphenols than more processed olive oils, although the amount can still vary among different types and brands.  One study gave 200 healthy young men about  2 tablespoons (25 ml) of different olive oils per day for three weeks.  The researchers found that participants' HDL levels increased significantly more after they consumed the olive oil with the highest polyphenol content (6).  In another study, when 66 older adults consumed about 4 tablespoons (50 ml) of high-polyphenol extra virgin olive oil every day for six weeks, their HDL cholesterol increased by 6.5 mg/dl, on average (7).  In addition to raising HDL levels, olive oil has been found to boost HDL's anti-inflammatory and antioxidant function in studies of older people and individuals with high cholesterol levels ( 7, 8, 9).  Whenever possible, select high-quality, certified extra virgin olive oils, which tend to be highest in polyphenols.  Bottom line: Extra virgin olive oil with a high polyphenol content has been shown to increase HDL levels in healthy people, the elderly and individuals with high cholesterol.  2. Follow a low-carb or ketogenic diet  Low-carb and ketogenic diets provide a number of health benefits, including weight loss and reduced blood sugar levels.  They have also been shown to increase HDL cholesterol in people who tend to have lower levels.  This includes those who are obese, insulin resistant or diabetic (10, 11, 12, 13, 14, 15, 16, 17).  In one study, people with type 2 diabetes were split into two groups.  One followed a diet consuming less than 50 grams of carbs per day. The other followed a high-carb diet.  Although both groups lost weight, the low-carb group's HDL cholesterol increased almost twice as much as the high-carb group's did (14).  In another study, obese people who followed a low-carb diet experienced an increase in HDL cholesterol of 5 mg/dl overall.  Meanwhile, in the same study, the participants who ate a low-fat, high-carb diet showed a decrease in HDL cholesterol (15).  This response may partially be due to the higher levels of fat  people typically consume on low-carb diets.  One study in overweight women found that diets high in meat and cheese increased HDL levels by 5-8%, compared to a higher-carb diet (18).  What's more, in addition to raising HDL cholesterol, very-low-carb diets have been shown to decrease triglycerides and improve several other risk factors for heart disease (13, 14, 16, 17).  Bottom line: Low-carb and ketogenic diets typically increase HDL cholesterol levels in people with diabetes, metabolic syndrome and obesity.  3. Exercise regularly  Being physically active is important for heart health.  Studies have shown that many different types of exercise are effective at raising HDL cholesterol, including strength training, high-intensity exercise and aerobic exercise (19, 20, 21, 22, 23, 24).  However, the biggest increases in HDL are typically seen with high-intensity exercise.  One small study followed women who were living with polycystic ovary syndrome (PCOS), which is linked to a higher risk of insulin resistance. The study required them to perform high-intensity exercise three times a week.  The exercise led to an increase in HDL cholesterol of 8 mg/dL after 10 weeks. The women also showed improvements in other health markers,  including decreased insulin resistance and improved arterial function (23).  In a 12-week study, overweight men who performed high-intensity exercise experienced a 10% increase in HDL cholesterol.  In contrast, the low-intensity exercise group showed only a 2% increase and the endurance training group experienced no change (24).  However, even lower-intensity exercise seems to increase HDL's anti-inflammatory and antioxidant capacities, whether or not HDL levels change (20, 21, 25).  Overall, high-intensity exercise such as high-intensity interval training (HIIT) and high-intensity circuit training (HICT) may boost HDL cholesterol levels the most.  Bottom line:  Exercising several times per week can help raise HDL cholesterol and enhance its anti-inflammatory and antioxidant effects. High-intensity forms of exercise may be especially effective.  4. Add coconut oil to your diet  Studies have shown that coconut oil may reduce appetite, increase metabolic rate and help protect brain health, among other benefits.  Some people may be concerned about coconut oil's effects on heart health due to its high saturated fat content.  However, it appears that coconut oil is actually quite heart healthy.  Coconut oil tends to raise HDL cholesterol more than many other types of fat.  In addition, it may improve the ratio of low-density-lipoprotein (LDL) cholesterol, the "bad" cholesterol, to HDL cholesterol. Improving this ratio reduces heart disease risk (26, 27, 28, 29).  One study examined the health effects of coconut oil on 23 women with excess belly fat. The researchers found that participants who took coconut oil daily experienced increased HDL cholesterol and a lower LDL-to-HDL ratio.  In contrast, the group who took soybean oil daily had a decrease in HDL cholesterol and an increase in the LDL-to-HDL ratio (29).  Most studies have found these health benefits occur at a dosage of about 2 tablespoons (30 ml) of coconut oil per day. It's best to incorporate this into cooking rather than eating spoonfuls of coconut oil on their own.  Bottom line: Consuming 2 tablespoons (30 ml) of coconut oil per day may help increase HDL cholesterol levels.  5. Stop smoking  cigarette butt Quitting smoking can reduce the risk of heart disease and lung cancer. Smoking increases the risk of many health problems, including heart disease and lung cancer (30).  One of its negative effects is a suppression of HDL cholesterol.  Some studies have found that quitting smoking can increase HDL levels. Indeed, one study found no significant differences in HDL levels between former  smokers and people who had never smoked (31, 32, 33, 34, 35).  In a one-year study of more than 1,500 people, those who quit smoking had twice the increase in HDL as those who resumed smoking within the year. The number of large HDL particles also increased, which further reduced heart disease risk (32).  One study followed smokers who switched from traditional cigarettes to electronic cigarettes for one year. They found that the switch was associated with an increase in HDL cholesterol of 5 mg/dl, on average (33).  When it comes to the effect of nicotine replacement patches on HDL levels, research results have been mixed.  One study found that nicotine replacement therapy led to higher HDL cholesterol. However, other research suggests that people who use nicotine patches likely won't see increases in HDL levels until after replacement therapy is completed (34, 36).  Even in studies where HDL cholesterol levels didn't increase after people quit smoking, HDL function improved, resulting in less inflammation and other beneficial effects on heart health (37).  Bottom line: Quitting smoking can increase HDL  levels, improve HDL function and help protect heart health.  6. Lose weight  When overweight and obese people lose weight, their HDL cholesterol levels usually increase.  What's more, this benefit seems to occur whether weight loss is achieved by calorie counting, carb restriction, intermittent fasting, weight loss surgery or a combination of diet and exercise (16, 38, 39, 40, 41, 42).  One study examined HDL levels in more than 3,000 overweight and obese [place] adults who followed a lifestyle modification program for one year.  The researchers found that losing at least 6.6 lbs (3 kg) led to an increase in HDL cholesterol of 4 mg/dl, on average (41).  In another study, when obese people with type 2 diabetes consumed calorie-restricted diets that provided 20-30% of calories from protein,  they experienced significant increases in HDL cholesterol levels (42).  The key to achieving and maintaining healthy HDL cholesterol levels is choosing the type of diet that makes it easiest for you to lose weight and keep it off.  Bottom Line: Several methods of weight loss have been shown to increase HDL cholesterol levels in people who are overweight or obese.  7. Choose purple produce  Consuming purple-colored fruits and vegetables is a delicious way to potentially increase HDL cholesterol.  Purple produce contains antioxidants known as anthocyanins.  Studies using anthocyanin extracts have shown that they help fight inflammation, protect your cells from damaging free radicals and may also raise HDL cholesterol levels (43, 44, 45, 46).  In a 24-week study of 51 people with diabetes, those who took an anthocyanin supplement twice a day experienced a 19% increase in HDL cholesterol, on average, along with other improvements in heart health markers (45).  In another study, when people with cholesterol issues took anthocyanin extract for 12 weeks, their HDL cholesterol levels increased by 13.7% (46).  Although these studies used extracts instead of foods, there are several fruits and vegetables that are very high in anthocyanins. These include eggplant, purple corn, red cabbage, blueberries, blackberries and black raspberries.  Bottom line: Consuming fruits and vegetables rich in anthocyanins may help increase HDL cholesterol levels.  8. Eat fatty fish often  The omega-3 fats in fatty fish provide major benefits to heart health, including a reduction in inflammation and better functioning of the cells that line your arteries (47, 48).  There's some research showing that eating fatty fish or taking fish oil may also help raise low levels of HDL cholesterol (49, 50, 51, 52, 53).  In a study of 33 heart disease patients, participants that consumed fatty fish four times per week experienced  an increase in HDL cholesterol levels. The particle size of their HDL also increased (52).  In another study, overweight men who consumed herring five days a week for six weeks had a 5% increase in HDL cholesterol, compared with their levels after eating lean pork and chicken five days a week (53).  However, there are a few studies that found no increase in HDL cholesterol in response to increased fish or omega-3 supplement intake (54, 55).  In addition to herring, other types of fatty fish that may help raise HDL cholesterol include salmon, sardines, mackerel and anchovies.  Bottom line: Eating fatty fish several times per week may help increase HDL cholesterol levels and provide other benefits to heart health.  9. Avoid artificial trans fats  Artificial trans fats have many negative health effects due to their inflammatory properties (56, 57).  There are two types of trans fats. One  kind occurs naturally in animal products, including full-fat dairy.  In contrast, the artificial trans fats found in margarines and processed foods are created by adding hydrogen to unsaturated vegetable and seed oils. These fats are also known as industrial trans fats or partially hydrogenated fats.  Research has shown that, in addition to increasing inflammation and contributing to several health problems, these artificial trans fats may lower HDL cholesterol levels.  In one study, researchers compared how people's HDL levels responded when they consumed different margarines.  The study found that participants' HDL cholesterol levels were 10% lower after consuming margarine containing partially hydrogenated soybean oil, compared to their levels after consuming palm oil (58).  Another controlled study followed 40 adults who had diets high in different types of trans fats.  They found that HDL cholesterol levels in women were significantly lower after they consumed the diet high in industrial trans fats,  compared to the diet containing naturally occurring trans fats (59).  To protect heart health and keep HDL cholesterol in the healthy range, it's best to avoid artificial trans fats altogether.  Bottom line: Artificial trans fats have been shown to lower HDL levels and increase inflammation, compared to other fats.  Take home message  Although your HDL cholesterol levels are partly determined by your genetics, there are many things you can do to naturally increase your own levels.  Fortunately, the practices that raise HDL cholesterol often provide other health benefits as well.

## 2017-10-02 NOTE — Progress Notes (Signed)
Impression and Recommendations:    1. Essential hypertension   2. Mixed hyperlipidemia   3. Statin intolerance- only able to tolerate small dose   4. Glucose intolerance (impaired glucose tolerance)   5. h/o Thyroid nodule- R s/p Bx 2016 or so   6. Chronic pain of right knee     1. HTN: -Patient blood pressure in the office today at 138/81.   -Encouraged the patient to continue taking her medications as prescribed.    2. Cholesterol:  -Prior labs from 07/08/2017 show LDL at 160.  -Discussed with patient regarding her 13.6% 10 year ASCVD risk score and the meaning of an increased risk of cardiovascular disease.   -The patient reports that she isn't taking her cholesterol medication daily a prescribe due to forgetting. Due to this, and her elevated HDL, I instructed the patient to began taking her cholesterol medication as prescribed and we will recheck her lipid panel on her next OV.  -If her LDL is not decreased by her next OV, then we will change her medication dose as needed.   3. Glucose Intolerance:   -Reviewed and discussed patient's recent A1c of 5.6 on last visit, 07/08/2017.   4. Smoking Cessation:  Told pt to think seriously about quitting smoking! Told pt it is very important for her health and well being.    Smoking cessation instruction/counseling given:  counseled patient on the dangers of tobacco use, advised patient to stop smoking, and reviewed strategies to maximize success  Discussed with patient that there are multiple treatments to aid in quitting smoking, however I explained none will work unless pt really want to quit  Told to call 1-800-QUIT-NOW 479-516-4325) for free smoking cessation counseling and support, or pt can go online to www.heart.org - the American Heart Association website and search "quit smoking ".    5. Exercise Management:  - Advised patient to continue working toward exercising to improve health.    -Pt will begin with 15  minutes of activity daily. Recommended that the patient eventually strive for at least 150 minutes of cardio per week according to the [facility].   - Healthy dietary habits encouraged, including low-carb, and high amounts of lean protein in diet.   - Patient should also consume adequate amounts of water - half of body weight in oz of water per day   6. Thyroid nodule -I reviewed the patient's pathology report and recent imaging regarding her thyroid with the patient today.   7. Chronic right knee pain -We will discuss this further on the next visit with possible imaging at the time of the visit.     Education and routine counseling performed. Handouts provided.  No orders of the defined types were placed in this encounter.   No orders of the defined types were placed in this encounter.    The patient was counseled, risk factors were discussed, anticipatory guidance given.  Gross side effects, risk and benefits, and alternatives of medications discussed with patient.  Patient is aware that all medications have potential side effects and we are unable to predict every side effect or drug-drug interaction that may occur.  Expresses verbal understanding and consents to current therapy plan and treatment regimen.  Return will need repeat ALT, FLP in 4-6 mo, for Follow-up near future for evaluation right knee pain.  Otherwise keep chronic follow-up in October.  Please see AVS handed out to patient at the end of our visit for further patient instructions/ counseling done  pertaining to today's office visit.    Note: This document was prepared using Dragon voice recognition software and may include unintentional dictation errors.   This document serves as a record of services personally performed by Mellody Dance, DO. It was created on her behalf by Steva Colder, a trained medical scribe. The creation of this record is based on the scribe's personal observations and the provider's statements to  them.   I have reviewed the above medical documentation for accuracy and completeness and I concur.  Mellody Dance 10/12/17 9:27 PM   Subjective:    HPI: Savannah Mendez is a 62 y.o. female who presents to [place] at [facility] today for follow up for HTN.    -Discussed and reviewed the results of her recent lab work today.    Smoking cessation:  She would like to stop smoking cigarettes, however, she has a lot of life stressors due to renovating her house, her daughters house, and her mothers house.   She notes that she noticed her triggers when it came to smoking and cut them out. She also started using affirmations to aid with decreasing smoking.   She previously quit smoking cigarettes in 2008 for 3 years and she notes that she is now smoking a little over 1 PPD currently.  Cholesterol:  -She hasn't taken her cholesterol medications as prescribed due to forgetting.for the past 6 months. She also hasn't been maintaining a healthy diet as she should.   -Also, she hasn't been exercising as she should have been typically.    HTN:  -  Her blood pressure has been controlled at home.   - Patient reports good compliance with blood pressure medications  - Denies medication S-E   - Smoking Status noted   - She denies new onset of: chest pain, exercise intolerance, shortness of breath, dizziness, visual changes, headache, lower extremity swelling or claudication.    Last 3 blood pressure readings in our office are as follows: BP Readings from Last 3 Encounters:  10/02/17 138/81  07/31/17 (!) 138/92  07/08/17 138/86    Pulse Readings from Last 3 Encounters:  10/02/17 (!) 59  07/31/17 71  07/08/17 (!) 58    Filed Weights   10/02/17 1032  Weight: 166 lb 6.4 oz (75.5 kg)      Patient Care Team    Relationship Specialty Notifications Start End  Mellody Dance, DO PCP - General Family Medicine  02/04/17   Jari Pigg, MD Consulting Physician  Dermatology  02/04/17   Marygrace Drought, MD Consulting Physician Ophthalmology  02/04/17   Nelwyn Salisbury, [place] Physician Assistant Obstetrics and Gynecology  02/10/17   Richmond Campbell, MD Consulting Physician Gastroenterology  07/08/17      Lab Results  Component Value Date   CREATININE 0.82 07/08/2017   BUN 13 07/08/2017   NA 142 07/08/2017   K 5.2 07/08/2017   CL 101 07/08/2017   CO2 25 07/08/2017    Lab Results  Component Value Date   CHOL 232 (H) 07/08/2017   CHOL 188 03/21/2017   CHOL 221 (A) 06/06/2016    Lab Results  Component Value Date   HDL 55 07/08/2017   HDL 57 03/21/2017   HDL 58 06/06/2016    Lab Results  Component Value Date   LDLCALC 160 (H) 07/08/2017   LDLCALC 114 (H) 03/21/2017   LDLCALC 143 06/06/2016    Lab Results  Component Value Date   TRIG 87 07/08/2017   TRIG 83  03/21/2017   TRIG 100 06/06/2016    Lab Results  Component Value Date   CHOLHDL 4.2 07/08/2017   CHOLHDL 3.3 03/21/2017    No results found for: LDLDIRECT ===================================================================   Patient Active Problem List   Diagnosis Date Noted  . Glucose intolerance (impaired glucose tolerance) 03/27/2017  . Statin intolerance- only able to tolerate small dose 03/27/2017  . Chronic pain of right knee 03/27/2017  . Hypertension 02/04/2017  . HLD (hyperlipidemia) 02/04/2017  . Adjustment disorder with mixed anxiety and depressed mood 02/04/2017  . Postmenopausal- 2008 02/04/2017  . S/P colonoscopy- 2010- N 02/04/2017  . h/o Thyroid nodule- R s/p Bx 2016 or so 02/04/2017     Past Medical History:  Diagnosis Date  . High cholesterol   . Hypercholesterolemia   . Hypertension   . Mixed hyperlipidemia   . Postmenopausal   . Pure hypercholesterolemia   . Thyroid nodule   . Tobacco abuse      Past Surgical History:  Procedure Laterality Date  . CESAREAN SECTION       Family History  Problem Relation Age of Onset  .  Hyperlipidemia Mother   . Hypertension Mother   . Stroke Mother   . Hypertension Father   . Hypertension Sister   . Breast cancer Maternal Aunt   . Lung cancer Paternal Uncle      Social History   Substance and Sexual Activity  Drug Use No  ,  Social History   Substance and Sexual Activity  Alcohol Use Yes   Comment: rarely  ,  Social History   Tobacco Use  Smoking Status Current Every Day Smoker  . Packs/day: 1.00  . Types: Cigarettes  Smokeless Tobacco Never Used  ,    Current Outpatient Medications on File Prior to Visit  Medication Sig Dispense Refill  . amlodipine-benazepril (LOTREL) 2.5-10 MG capsule Take 1 capsule by mouth daily. 90 capsule 1  . Cholecalciferol (VITAMIN D3) 5000 units CAPS Take 1 capsule by mouth daily.    . Coenzyme Q10 (COQ10 PO) Take 1 capsule by mouth daily.    . hydrocortisone 1 % lotion Apply 1 application topically 4 (four) times daily. Use over-the-counter cream 118 mL 0  . Omega 3 1200 MG CAPS Take 1 capsule by mouth daily.    . rosuvastatin (CRESTOR) 10 MG tablet Take 10 mg by mouth daily.     No current facility-administered medications on file prior to visit.      Allergies  Allergen Reactions  . Paxil [Paroxetine]      Review of Systems:   General:  Denies fever, chills Optho/Auditory:   Denies visual changes, blurred vision Respiratory:   Denies SOB, cough, wheeze, DIB  Cardiovascular:   Denies chest pain, palpitations, painful respirations Gastrointestinal:   Denies nausea, vomiting, diarrhea.  Endocrine:     Denies new hot or cold intolerance Musculoskeletal:  Denies joint swelling, gait issues, or new unexplained myalgias/ arthralgias Skin:  Denies rash, suspicious lesions  Neurological:    Denies dizziness, unexplained weakness, numbness  Psychiatric/Behavioral:   Denies mood changes  Objective:    Blood pressure 138/81, pulse (!) 59, height 5\' 11"  (1.803 m), weight 166 lb 6.4 oz (75.5 kg), SpO2 97 %.  Body  mass index is 23.21 kg/m.  General: Well Developed, well nourished, and in no acute distress.  HEENT: Normocephalic, atraumatic, pupils equal round reactive to light, neck supple, No carotid bruits, no JVD Skin: Warm and dry, cap RF less 2 sec  Cardiac: Regular rate and rhythm, S1, S2 WNL's, no murmurs rubs or gallops Respiratory: ECTA B/L, Not using accessory muscles, speaking in full sentences. NeuroM-Sk: Ambulates w/o assistance, moves ext * 4 w/o difficulty, sensation grossly intact.  Ext: scant edema b/l lower ext Psych: No HI/SI, judgement and insight good, Euthymic mood. Full Affect.

## 2017-12-04 ENCOUNTER — Ambulatory Visit: Payer: BC Managed Care – PPO

## 2017-12-04 ENCOUNTER — Encounter: Payer: Self-pay | Admitting: Family Medicine

## 2017-12-04 ENCOUNTER — Ambulatory Visit: Payer: BC Managed Care – PPO | Admitting: Family Medicine

## 2017-12-04 VITALS — BP 135/86 | HR 69 | Ht 71.0 in | Wt 167.2 lb

## 2017-12-04 DIAGNOSIS — G8929 Other chronic pain: Secondary | ICD-10-CM

## 2017-12-04 DIAGNOSIS — M25561 Pain in right knee: Secondary | ICD-10-CM | POA: Diagnosis not present

## 2017-12-04 NOTE — Patient Instructions (Signed)

## 2017-12-04 NOTE — Assessment & Plan Note (Signed)
Assessment \ plan: -Obtain x-ray today in the office since she is never had one -Continue conservative methods after our discussion patient wishes to proceed with physical therapy, icing and using NSAIDs when it bothers her.   - Also she will avoid aggravating activities such as squatting, deep bends etc. -We can consider joint injection in the future if physical therapy over 6 to 8 weeks does not help -Red flag symptoms discussed with patient and if she develops any she will give [place] a call and we can refer her out to sports med and/or orthopedics

## 2017-12-04 NOTE — Progress Notes (Signed)
Pt here for an acute care OV today   Impression and Recommendations:    1. Chronic pain of right knee     Chronic pain of right knee Assessment \ plan: -Obtain x-ray today in the office since she is never had one -Continue conservative methods after our discussion patient wishes to proceed with physical therapy, icing and using NSAIDs when it bothers her.   - Also she will avoid aggravating activities such as squatting, deep bends etc. -We can consider joint injection in the future if physical therapy over 6 to 8 weeks does not help -Red flag symptoms discussed with patient and if she develops any she will give [place] a call and we can refer her out to sports med and/or orthopedics     Orders Placed This Encounter  Procedures  . DG Knee Complete 4 Views Right  . Ambulatory referral to Physical Therapy     Education and routine counseling performed. Handouts provided  Gross side effects, risk and benefits, and alternatives of medications and treatment plan in general discussed with patient.  Patient is aware that all medications have potential side effects and we are unable to predict every side effect or drug-drug interaction that may occur.   Patient will call with any questions prior to using medication if they have concerns.  Expresses verbal understanding and consents to current therapy and treatment regimen.  No barriers to understanding were identified.  Red flag symptoms and signs discussed in detail.  Patient expressed understanding regarding what to do in case of emergency\urgent symptoms   Please see AVS handed out to patient at the end of our visit for further patient instructions/ counseling done pertaining to today's office visit.   Return if symptoms worsen or fail to improve.  Told to also keep chronic follow-up OV's     Note:  This document was prepared occasionally using Dragon voice recognition software and may include unintentional dictation errors in  addition to a scribe.   Mellody Dance 12/04/17 10:39 AM  --------------------------------------------------------------------------------------------------------------------------------------------------------------------------------------------------------------------------------------------    Subjective:    CC:  Chief Complaint  Patient presents with  . Knee Pain    HPI: Savannah Mendez is a 62 y.o. female who presents to [place] at [facility] today for issues as discussed below.   Problem  Chronic Pain of Right Knee   Patient has knee pain for 1 year now ever since getting out of a passenger side car door and she felt like she "tweaked it ".  It has been swollen and painful at times ever since.  This has waxed and waned.  She has never felt like it was going to give way or feel unstable to her.  However she feels that she "does not trust it at times.  "    She is concerned because it is never improved and she has used ice in the past as well as ibuprofen but nothing recently.  It did get better with these treatments but did not last and she is wondering why it is continued all this time.  Her pain is mostly in the medial aspect of her right knee as well as superior aspect.  This is worsened by squatting and also sleeps on her side and at times it can hurt when she is sleeping on her side at night.   Pain is improved with Advil and ice.  She has no prior injury to the knee and had no mechanism of injury besides  getting out of the car over a year ago.       Wt Readings from Last 3 Encounters:  12/04/17 167 lb 3.2 oz (75.8 kg)  10/02/17 166 lb 6.4 oz (75.5 kg)  07/31/17 169 lb (76.7 kg)   BP Readings from Last 3 Encounters:  12/04/17 135/86  10/02/17 138/81  07/31/17 (!) 138/92   BMI Readings from Last 3 Encounters:  12/04/17 23.32 kg/m  10/02/17 23.21 kg/m  07/31/17 23.57 kg/m     Patient Care Team    Relationship Specialty Notifications  Start End  Mellody Dance, DO PCP - General Family Medicine  02/04/17   Jari Pigg, MD Consulting Physician Dermatology  02/04/17   Marygrace Drought, MD Consulting Physician Ophthalmology  02/04/17   Nelwyn Salisbury, [place] Physician Assistant Obstetrics and Gynecology  02/10/17   Richmond Campbell, MD Consulting Physician Gastroenterology  07/08/17      Patient Active Problem List   Diagnosis Date Noted  . Glucose intolerance (impaired glucose tolerance) 03/27/2017  . Statin intolerance- only able to tolerate small dose 03/27/2017  . Chronic pain of right knee 03/27/2017  . Hypertension 02/04/2017  . HLD (hyperlipidemia) 02/04/2017  . Adjustment disorder with mixed anxiety and depressed mood 02/04/2017  . Postmenopausal- 2008 02/04/2017  . S/P colonoscopy- 2010- N 02/04/2017  . h/o Thyroid nodule- R s/p Bx 2016 or so 02/04/2017    Past Medical history, Surgical history, Family history, Social history, Allergies and Medications have been entered into the medical record, reviewed and changed as needed.    Current Meds  Medication Sig  . amlodipine-benazepril (LOTREL) 2.5-10 MG capsule Take 1 capsule by mouth daily.  . Cholecalciferol (VITAMIN D3) 5000 units CAPS Take 1 capsule by mouth daily.  . Coenzyme Q10 (COQ10 PO) Take 1 capsule by mouth daily.  . hydrocortisone 1 % lotion Apply 1 application topically 4 (four) times daily. Use over-the-counter cream  . Omega 3 1200 MG CAPS Take 1 capsule by mouth daily.  . rosuvastatin (CRESTOR) 10 MG tablet Take 10 mg by mouth daily.    Allergies:  Allergies  Allergen Reactions  . Paxil [Paroxetine]      Review of Systems: General:   Denies fever, chills, unexplained weight loss.  Optho/Auditory:   Denies visual changes, blurred vision/LOV Respiratory:   Denies wheeze, DOE more than baseline levels.   Cardiovascular:   Denies chest pain, palpitations, new onset peripheral edema  Gastrointestinal:   Denies nausea, vomiting, diarrhea,  abd pain.  Genitourinary: Denies dysuria, freq/ urgency, flank pain or discharge from genitals.  Endocrine:     Denies hot or cold intolerance, polyuria, polydipsia. Musculoskeletal:   Denies unexplained myalgias, joint swelling, unexplained arthralgias, gait problems.  Skin:  Denies new onset rash, suspicious lesions Neurological:     Denies dizziness, unexplained weakness, numbness  Psychiatric/Behavioral:   Denies mood changes, suicidal or homicidal ideations, hallucinations    Objective:   Blood pressure 135/86, pulse 69, height 5\' 11"  (1.803 m), weight 167 lb 3.2 oz (75.8 kg), SpO2 98 %. Body mass index is 23.32 kg/m. General:  Well Developed, well nourished, appropriate for stated age.  Neuro:  Alert and oriented,  extra-ocular muscles intact  HEENT:  Normocephalic, atraumatic Skin:  no gross rash, warm, pink. Vascular:  Ext warm, no cyanosis apprec.; cap RF less 2 sec. Psych:  No HI/SI, judgement and insight good, Euthymic mood. Full Affect. Knee: Normal to inspection with no erythema; very mild effusion present in the medial and superior patellar region.  No obvious bony abnormalities. Palpation normal with no warmth, joint line tenderness, patellar tenderness, or condyle tenderness. ROM full in flexion and extension and lower leg rotation. Ligaments with solid endpoint including ACL, PCL, LCL, MCL. Negative Mcmurray's, Appley's. Non painful patellar compression. Patellar glide without crepitus.

## 2017-12-17 ENCOUNTER — Encounter: Payer: Self-pay | Admitting: Physical Therapy

## 2017-12-17 ENCOUNTER — Other Ambulatory Visit: Payer: Self-pay

## 2017-12-17 ENCOUNTER — Ambulatory Visit: Payer: BC Managed Care – PPO | Attending: Family Medicine | Admitting: Physical Therapy

## 2017-12-17 DIAGNOSIS — M25661 Stiffness of right knee, not elsewhere classified: Secondary | ICD-10-CM | POA: Diagnosis present

## 2017-12-17 DIAGNOSIS — R269 Unspecified abnormalities of gait and mobility: Secondary | ICD-10-CM | POA: Insufficient documentation

## 2017-12-17 DIAGNOSIS — R600 Localized edema: Secondary | ICD-10-CM | POA: Diagnosis present

## 2017-12-17 DIAGNOSIS — M6281 Muscle weakness (generalized): Secondary | ICD-10-CM | POA: Diagnosis present

## 2017-12-17 DIAGNOSIS — G8929 Other chronic pain: Secondary | ICD-10-CM

## 2017-12-17 DIAGNOSIS — M25561 Pain in right knee: Secondary | ICD-10-CM | POA: Diagnosis not present

## 2017-12-17 NOTE — Therapy (Signed)
[place], [place], [address] Phone: [phone]   Fax:  [phone]  Physical Therapy Evaluation  Patient Details  Name: Savannah Mendez MRN: [id number] Date of Birth: Mar 29, 1955 Referring Provider (PT): Mellody Dance MD   Encounter Date: 12/17/2017  PT End of Session - 12/17/17 1053    Visit Number  1    Number of Visits  13    Date for PT Re-Evaluation  01/28/18    PT Start Time  1055    PT Stop Time  1142    PT Time Calculation (min)  47 min    Activity Tolerance  Patient tolerated treatment well    Behavior During Therapy  [facility] for tasks assessed/performed       Past Medical History:  Diagnosis Date  . Allergy    to medication  . High cholesterol   . Hypercholesterolemia   . Hypertension   . Mixed hyperlipidemia   . Postmenopausal   . Pure hypercholesterolemia   . Thyroid nodule   . Tobacco abuse     Past Surgical History:  Procedure Laterality Date  . CESAREAN SECTION      There were no vitals filed for this visit.   Subjective Assessment - 12/17/17 1058    Subjective  pt is a 62 y.o F with CC of R knee that began about a year ago when she was stepping out of a SUV with R leg and felt a slight pop and twinge int he knee. Pain flucatuates depending on activity and positiong. denies N/T mostly just pain and swelling around the knee cap. she denies any buckliing but reports fear of it buckling, but does report some catching. reports no specific hx of R knee pain.     How long can you sit comfortably?  unlimited    How long can you stand comfortably?  30 min     How long can you walk comfortably?  30-45 min     Diagnostic tests  9/19 x-ray    Patient Stated Goals  decrease pain    Currently in Pain?  Yes    Pain Score  5    at worst 7/10   Pain Location  Knee    Pain Orientation  Right;Medial;Lateral;Anterior    Pain Descriptors / Indicators  Pressure;Stabbing;Aching    Pain Type   Chronic pain    Pain Onset  More than a month ago    Pain Frequency  Intermittent    Aggravating Factors   descending stairs, prolonged walking, laying down at night    Pain Relieving Factors  tylenol, ibuprofen, ice    Effect of Pain on Daily Activities  limited endurance         [facility] PT Assessment - 12/17/17 1053      Assessment   Medical Diagnosis  R knee pain    Referring Provider (PT)  Mellody Dance MD    Onset Date/Surgical Date  --   1 year   Hand Dominance  Right    Next MD Visit  02/05/2018    Prior Therapy  no      Precautions   Precautions  None      Restrictions   Weight Bearing Restrictions  No      Balance Screen   Has the patient fallen in the past 6 months  No    Has the patient had a decrease in activity level because of a fear of falling?   No  Is the patient reluctant to leave their home because of a fear of falling?   No      Home Environment   Living Environment  Private residence    Living Arrangements  Spouse/significant other    Available Help at Discharge  Family;Available PRN/intermittently    Type of Home  House    Home Access  Stairs to enter    Entrance Stairs-Number of Steps  3    Entrance Stairs-Rails  None    Home Layout  Two level    Alternate Level Stairs-Number of Steps  13    Alternate Level Stairs-Rails  Right   ascending   Home Equipment  None      Prior Function   Level of Independence  Independent with basic ADLs    Vocation  Retired    Leisure  working in the yard, Psychologist, educational at the daughters house      Cognition   Overall Cognitive Status  Within Functional Limits for tasks assessed      Observation/Other Assessments   Focus on Therapeutic Outcomes (FOTO)   52% limited   predicted 38% limited     Posture/Postural Control   Posture/Postural Control  Postural limitations    Postural Limitations  Rounded Shoulders;Forward head      ROM / Strength   AROM / PROM / Strength  AROM;PROM;Strength      AROM   AROM  Assessment Site  Knee    Right/Left Knee  Right;Left    Right Knee Extension  -10    Right Knee Flexion  130    Left Knee Extension  0    Left Knee Flexion  130      PROM   PROM Assessment Site  Knee    Right/Left Knee  Right    Right Knee Extension  -8   pain at end range     Strength   Strength Assessment Site  Knee;Hip    Right Hip ABduction  3+/5    Left Hip ABduction  3+/5    Right/Left Knee  Right;Left    Right Knee Flexion  5/5    Right Knee Extension  5/5   pain during testing   Left Knee Flexion  5/5    Left Knee Extension  5/5      Palpation   Patella mobility  mild swelling noted peri-patellar, hypomobility of the patella L compared bil    Palpation comment  TTP along the medial joint line and and the distal quad tendon, mulitple triggfer points noted in the vastus lateralis and limited VMO activation       Special Tests    Special Tests  Knee Special Tests;Meniscus Tests    Knee Special tests   Step-up/Step Down Test;other    Meniscus Tests  McMurray Test      Step-up/Step Down    Findings  Positive    Side   Right    Comments  pt rotates foot and leg out for stepping down due to medial collapse of the knee      McMurray Test   Findings  Positive    Side  Right    Comments  pain and popping noted along the medial joint line      Ambulation/Gait   Ambulation/Gait  Yes    Gait Pattern  Trendelenburg;Antalgic;Right flexed knee in stance;Step-through pattern                Objective measurements completed on examination: See above findings.  [facility] PT Treatment/Exercise - 12/17/17 1053      Exercises   Exercises  Knee/Hip      Knee/Hip Exercises: Standing   Step Down  1 set;5 reps   focusing on keep knee inline with toes for alignment     Knee/Hip Exercises: Supine   Short Arc Quad Sets  2 sets;10 reps   with ball squeeze for VMO facilitation     Knee/Hip Exercises: Sidelying   Hip ABduction  1 set;10 reps;Both;Strengthening       Manual Therapy   Manual therapy comments  MTPR along the vastus lateralis x 3             PT Education - 12/17/17 1054    Education Details  evaluation findings, POC, goals, HEP with proper form/ rationale, anatomy of area involved. proper mechanics with stepping down to promote patellofemoral alignment    Person(s) Educated  Patient    Methods  Explanation;Verbal cues;Handout    Comprehension  Verbalized understanding;Verbal cues required       PT Short Term Goals - 12/17/17 1154      PT SHORT TERM GOAL #1   Title  pt to be I with intial HEP    Time  3    Period  Weeks    Status  New    Target Date  01/07/18      PT SHORT TERM GOAL #2   Title  pt to verbalize and demo proper posture and gait and stair mechanics to prevent and reduce R knee pain    Time  3    Period  Weeks    Status  New    Target Date  01/07/18        PT Long Term Goals - 12/17/17 1203      PT LONG TERM GOAL #1   Title  pt to increase knee extension to </= -2 degrees to promote proper gait and stair mechanics with </= 1/10 pain     Time  6    Period  Weeks    Status  New    Target Date  01/28/18      PT LONG TERM GOAL #2   Title  increase bil hip abd strength to >/= 4+/5 to promote knee stability with walking/ standing activities     Time  6    Period  Weeks    Status  New    Target Date  01/28/18      PT LONG TERM GOAL #3   Title  pt to be able to naviage up/down >/= 15 steps reciprocally with </= 1/10 pain in the R knee and no report of feeling like its going to buckle    Time  6    Period  Weeks    Status  New    Target Date  01/28/18      PT LONG TERM GOAL #4   Title  increase FOTO score to </= 38% limited to demo improvement in function     Time  6    Period  Weeks    Status  New    Target Date  01/28/18      PT LONG TERM GOAL #5   Title  pt to be I with inital HEP given as of last visit to maintain and progress current level of function     Time  6    Period  Weeks     Status  New    Target Date  01/28/18             Plan - 12/17/17 1149    Clinical Impression Statement  pt presents to OPPT with CC of R knee pain starting 1 year after stepping out of a SUV noting a popping in the knee. she demonstrates limited knee extension due to pain and edema. functonal knee strength with pain during testing with R knee extension and weakness in bil hips. abnormal posture with stair training and special testing positive for potential PFPS. she would benefit from physical therapy to decrease R knee pain, improve ROM, and maximize her function by addressing the defictis listed.     History and Personal Factors relevant to plan of care:  hx of HTN    Clinical Presentation  Evolving    Clinical Presentation due to:  chronic fluctuating pain, limited knee extension, abnormal gait    Clinical Decision Making  Moderate    Rehab Potential  Good    PT Frequency  2x / week    PT Duration  6 weeks    PT Treatment/Interventions  ADLs/Self Care Home Management;Cryotherapy;Electrical Stimulation;Iontophoresis 4mg /ml Dexamethasone;Moist Heat;Ultrasound;Neuromuscular re-education;Balance training;Stair training;Gait training;Therapeutic activities;Manual techniques;Therapeutic exercise;Vasopneumatic Device;Taping;Dry needling;Patient/family education;Passive range of motion    PT Next Visit Plan  review/ update HEP, PFPS: STW vastus lateralis, VMO activation, hip strengthening abductors and external rotators, gait and stair training    PT Home Exercise Plan  SAQ with VMO activation, reverse clamshell, sidelying hip abduction    Consulted and Agree with Plan of Care  Patient       Patient will benefit from skilled therapeutic intervention in order to improve the following deficits and impairments:  Abnormal gait, Pain, Increased fascial restricitons, Decreased strength, Decreased activity tolerance, Decreased endurance, Decreased balance, Increased edema, Decreased range of  motion  Visit Diagnosis: Chronic pain of right knee  Stiffness of right knee, not elsewhere classified  Localized edema  Muscle weakness (generalized)  Abnormal gait     Problem List Patient Active Problem List   Diagnosis Date Noted  . Glucose intolerance (impaired glucose tolerance) 03/27/2017  . Statin intolerance- only able to tolerate small dose 03/27/2017  . Chronic pain of right knee 03/27/2017  . Hypertension 02/04/2017  . HLD (hyperlipidemia) 02/04/2017  . Adjustment disorder with mixed anxiety and depressed mood 02/04/2017  . Postmenopausal- 2008 02/04/2017  . S/P colonoscopy- 2010- N 02/04/2017  . h/o Thyroid nodule- R s/p Bx 2016 or so 02/04/2017   Starr Lake PT, DPT, LAT, ATC  12/17/17  12:10 PM       [facility] [address] [place], [place], [address] Phone: [phone]   Fax:  [phone]  Name: Savannah Mendez MRN: [id number] Date of Birth: [birth year]

## 2017-12-25 ENCOUNTER — Encounter: Payer: BC Managed Care – PPO | Admitting: Family Medicine

## 2017-12-31 ENCOUNTER — Ambulatory Visit: Payer: BC Managed Care – PPO | Admitting: Physical Therapy

## 2017-12-31 DIAGNOSIS — R600 Localized edema: Secondary | ICD-10-CM

## 2017-12-31 DIAGNOSIS — R269 Unspecified abnormalities of gait and mobility: Secondary | ICD-10-CM

## 2017-12-31 DIAGNOSIS — M25661 Stiffness of right knee, not elsewhere classified: Secondary | ICD-10-CM

## 2017-12-31 DIAGNOSIS — M25561 Pain in right knee: Principal | ICD-10-CM

## 2017-12-31 DIAGNOSIS — M6281 Muscle weakness (generalized): Secondary | ICD-10-CM

## 2017-12-31 DIAGNOSIS — G8929 Other chronic pain: Secondary | ICD-10-CM

## 2017-12-31 NOTE — Patient Instructions (Signed)
Quad Set    Slowly tighten muscles on thigh of straight leg while counting out loud to _5___. Repeat ___10-20_ times. Do __2__ sessions per day.  Hamstring Stretch    With other leg bent, foot flat, grasp right leg and slowly try to straighten knee. Hold __30__ seconds. Repeat __3__ times. Do __2__ sessions per day.

## 2017-12-31 NOTE — Therapy (Signed)
[place], [place], [address] Phone: [phone]   Fax:  [phone]  Physical Therapy Treatment  Patient Details  Name: Savannah Mendez MRN: [id number] Date of Birth: 1955/05/19 Referring Provider (PT): Mellody Dance MD   Encounter Date: 12/31/2017  PT End of Session - 12/31/17 1047    Visit Number  2    Number of Visits  13    Date for PT Re-Evaluation  01/28/18    PT Start Time  6237    PT Stop Time  1140    PT Time Calculation (min)  55 min       Past Medical History:  Diagnosis Date  . Allergy    to medication  . High cholesterol   . Hypercholesterolemia   . Hypertension   . Mixed hyperlipidemia   . Postmenopausal   . Pure hypercholesterolemia   . Thyroid nodule   . Tobacco abuse     Past Surgical History:  Procedure Laterality Date  . CESAREAN SECTION      There were no vitals filed for this visit.  Subjective Assessment - 12/31/17 1047    Currently in Pain?  Yes    Aggravating Factors   descending stairs, prolonged activity on feet     Pain Relieving Factors  tylenol, ibuprofen, ice                        [facility] Adult PT Treatment/Exercise - 12/31/17 0001      Exercises   Exercises  Knee/Hip      Knee/Hip Exercises: Supine   Quad Sets  10 reps;3 sets    Quad Sets Limitations  1 set on and off heel prop, then one set with ball squeeze     Short Arc Quad Sets  2 sets;10 reps   with ball squeeze for VMO facilitation   Bridges  10 reps      Knee/Hip Exercises: Sidelying   Hip ABduction  1 set;10 reps;Both;Strengthening    Clams  Reverse clam 10 x 2      Modalities   Modalities  Cryotherapy;Moist Heat      Moist Heat Therapy   Number Minutes Moist Heat  10 Minutes    Moist Heat Location  Knee   thigh     Cryotherapy   Number Minutes Cryotherapy  10 Minutes    Cryotherapy Location  Knee    Type of Cryotherapy  Ice pack      Manual Therapy   Manual  therapy comments  MTPR along the vastus lateralis x 3, massage roller  to thigh                PT Short Term Goals - 12/17/17 1154      PT SHORT TERM GOAL #1   Title  pt to be I with intial HEP    Time  3    Period  Weeks    Status  New    Target Date  01/07/18      PT SHORT TERM GOAL #2   Title  pt to verbalize and demo proper posture and gait and stair mechanics to prevent and reduce R knee pain    Time  3    Period  Weeks    Status  New    Target Date  01/07/18        PT Long Term Goals - 12/17/17 1203      PT LONG TERM  GOAL #1   Title  pt to increase knee extension to </= -2 degrees to promote proper gait and stair mechanics with </= 1/10 pain     Time  6    Period  Weeks    Status  New    Target Date  01/28/18      PT LONG TERM GOAL #2   Title  increase bil hip abd strength to >/= 4+/5 to promote knee stability with walking/ standing activities     Time  6    Period  Weeks    Status  New    Target Date  01/28/18      PT LONG TERM GOAL #3   Title  pt to be able to naviage up/down >/= 15 steps reciprocally with </= 1/10 pain in the R knee and no report of feeling like its going to buckle    Time  6    Period  Weeks    Status  New    Target Date  01/28/18      PT LONG TERM GOAL #4   Title  increase FOTO score to </= 38% limited to demo improvement in function     Time  6    Period  Weeks    Status  New    Target Date  01/28/18      PT LONG TERM GOAL #5   Title  pt to be I with inital HEP given as of last visit to maintain and progress current level of function     Time  6    Period  Weeks    Status  New    Target Date  01/28/18            Plan - 12/31/17 1107    Clinical Impression Statement  Pt reports compliance with HEP. She notes some decreased pain with descending stairs and increased knee comfort at night with sleep positioning. Reviewed HEP and progressed with VMO activation exercises and hamstring stretching, Manual performed to  hip flexor, quad to decrease tightness. HMP to thigh and ice to knee performed at end of session to decrease post therapy soreness.     PT Next Visit Plan  review/ update HEP, PFPS: STW vastus lateralis, VMO activation, hip strengthening abductors and external rotators, gait and stair training    PT Home Exercise Plan  SAQ with VMO activation, reverse clamshell, sidelying hip abduction    Consulted and Agree with Plan of Care  Patient       Patient will benefit from skilled therapeutic intervention in order to improve the following deficits and impairments:  Abnormal gait, Pain, Increased fascial restricitons, Decreased strength, Decreased activity tolerance, Decreased endurance, Decreased balance, Increased edema, Decreased range of motion  Visit Diagnosis: Chronic pain of right knee  Stiffness of right knee, not elsewhere classified  Localized edema  Muscle weakness (generalized)  Abnormal gait     Problem List Patient Active Problem List   Diagnosis Date Noted  . Glucose intolerance (impaired glucose tolerance) 03/27/2017  . Statin intolerance- only able to tolerate small dose 03/27/2017  . Chronic pain of right knee 03/27/2017  . Hypertension 02/04/2017  . HLD (hyperlipidemia) 02/04/2017  . Adjustment disorder with mixed anxiety and depressed mood 02/04/2017  . Postmenopausal- 2008 02/04/2017  . S/P colonoscopy- 2010- N 02/04/2017  . h/o Thyroid nodule- R s/p Bx 2016 or so 02/04/2017    Dorene Ar, PTA 12/31/2017, 11:43 AM  [facility] [address]  [place], [place], [address] Phone: [phone]   Fax:  [phone]  Name: Melvina Pangelinan MRN: [id number] Date of Birth: 09-28-1955

## 2018-01-02 ENCOUNTER — Ambulatory Visit: Payer: BC Managed Care – PPO | Admitting: Physical Therapy

## 2018-01-02 ENCOUNTER — Encounter: Payer: Self-pay | Admitting: Physical Therapy

## 2018-01-02 DIAGNOSIS — M25561 Pain in right knee: Principal | ICD-10-CM

## 2018-01-02 DIAGNOSIS — M6281 Muscle weakness (generalized): Secondary | ICD-10-CM

## 2018-01-02 DIAGNOSIS — R269 Unspecified abnormalities of gait and mobility: Secondary | ICD-10-CM

## 2018-01-02 DIAGNOSIS — G8929 Other chronic pain: Secondary | ICD-10-CM

## 2018-01-02 DIAGNOSIS — M25661 Stiffness of right knee, not elsewhere classified: Secondary | ICD-10-CM

## 2018-01-02 DIAGNOSIS — R600 Localized edema: Secondary | ICD-10-CM

## 2018-01-02 NOTE — Therapy (Signed)
[place], [place], [address] Phone: [phone]   Fax:  [phone]  Physical Therapy Treatment  Patient Details  Name: Savannah Mendez MRN: [id number] Date of Birth: 01/08/1956 Referring Provider (PT): Mellody Dance MD   Encounter Date: 01/02/2018  PT End of Session - 01/02/18 0932    Visit Number  3    Number of Visits  13    Date for PT Re-Evaluation  01/28/18    PT Start Time  0933    PT Stop Time  1014    PT Time Calculation (min)  41 min    Activity Tolerance  Patient tolerated treatment well    Behavior During Therapy  [facility] for tasks assessed/performed       Past Medical History:  Diagnosis Date  . Allergy    to medication  . High cholesterol   . Hypercholesterolemia   . Hypertension   . Mixed hyperlipidemia   . Postmenopausal   . Pure hypercholesterolemia   . Thyroid nodule   . Tobacco abuse     Past Surgical History:  Procedure Laterality Date  . CESAREAN SECTION      There were no vitals filed for this visit.  Subjective Assessment - 01/02/18 0933    Subjective  "I still feel like my knee isn't just right, and I think my muscles are soreness"    Currently in Pain?  No/denies    Pain Score  0-No pain    Pain Orientation  Right;Medial;Lateral    Pain Descriptors / Indicators  Tightness    Pain Type  Chronic pain    Pain Onset  More than a month ago    Aggravating Factors   stairs                        [facility] Adult PT Treatment/Exercise - 01/02/18 0001      Exercises   Exercises  Knee/Hip      Knee/Hip Exercises: Stretches   Active Hamstring Stretch  2 reps;30 seconds    Gastroc Stretch  2 reps;30 seconds;Both   slant board     Knee/Hip Exercises: Standing   Hip Abduction  2 sets;15 reps;Knee straight    Stairs  navigating up/ down 6 inch steps x 5 utilizing proper form      Knee/Hip Exercises: Seated   Sit to Sand  10 reps   x 2 sets with ball  between the knees     Knee/Hip Exercises: Supine   Straight Leg Raise with External Rotation  Right   2 x 12     Manual Therapy   Manual Therapy  Joint mobilization;Taping    Joint Mobilization  sustained patellar mobs medially               PT Short Term Goals - 12/17/17 1154      PT SHORT TERM GOAL #1   Title  pt to be I with intial HEP    Time  3    Period  Weeks    Status  New    Target Date  01/07/18      PT SHORT TERM GOAL #2   Title  pt to verbalize and demo proper posture and gait and stair mechanics to prevent and reduce R knee pain    Time  3    Period  Weeks    Status  New    Target Date  01/07/18  PT Long Term Goals - 12/17/17 1203      PT LONG TERM GOAL #1   Title  pt to increase knee extension to </= -2 degrees to promote proper gait and stair mechanics with </= 1/10 pain     Time  6    Period  Weeks    Status  New    Target Date  01/28/18      PT LONG TERM GOAL #2   Title  increase bil hip abd strength to >/= 4+/5 to promote knee stability with walking/ standing activities     Time  6    Period  Weeks    Status  New    Target Date  01/28/18      PT LONG TERM GOAL #3   Title  pt to be able to naviage up/down >/= 15 steps reciprocally with </= 1/10 pain in the R knee and no report of feeling like its going to buckle    Time  6    Period  Weeks    Status  New    Target Date  01/28/18      PT LONG TERM GOAL #4   Title  increase FOTO score to </= 38% limited to demo improvement in function     Time  6    Period  Weeks    Status  New    Target Date  01/28/18      PT LONG TERM GOAL #5   Title  pt to be I with inital HEP given as of last visit to maintain and progress current level of function     Time  6    Period  Weeks    Status  New    Target Date  01/28/18            Plan - 01/02/18 1021    Clinical Impression Statement  pt is making progress reporing she feels she is doing better but still feels alittle off in the  knee. continued working on hip / knee strengthening focusing on VMO facilitation. progressed to standing hip abduction and continued practicing stair training which she demosntrated good form with. no pain noted end of session.     PT Treatment/Interventions  ADLs/Self Care Home Management;Cryotherapy;Electrical Stimulation;Iontophoresis 4mg /ml Dexamethasone;Moist Heat;Ultrasound;Neuromuscular re-education;Balance training;Stair training;Gait training;Therapeutic activities;Manual techniques;Therapeutic exercise;Vasopneumatic Device;Taping;Dry needling;Patient/family education;Passive range of motion    PT Next Visit Plan   update HEP, PFPS: STW vastus lateralis, VMO activation, hip strengthening abductors and external rotators, gait and stair training    PT Home Exercise Plan  SAQ with VMO activation, reverse clamshell, sidelying hip abduction, standing hip abduction     Consulted and Agree with Plan of Care  Patient       Patient will benefit from skilled therapeutic intervention in order to improve the following deficits and impairments:  Abnormal gait, Pain, Increased fascial restricitons, Decreased strength, Decreased activity tolerance, Decreased endurance, Decreased balance, Increased edema, Decreased range of motion  Visit Diagnosis: Chronic pain of right knee  Stiffness of right knee, not elsewhere classified  Localized edema  Muscle weakness (generalized)  Abnormal gait     Problem List Patient Active Problem List   Diagnosis Date Noted  . Glucose intolerance (impaired glucose tolerance) 03/27/2017  . Statin intolerance- only able to tolerate small dose 03/27/2017  . Chronic pain of right knee 03/27/2017  . Hypertension 02/04/2017  . HLD (hyperlipidemia) 02/04/2017  . Adjustment disorder with mixed anxiety and depressed mood 02/04/2017  .  Postmenopausal- 2008 02/04/2017  . S/P colonoscopy- 2010- N 02/04/2017  . h/o Thyroid nodule- R s/p Bx 2016 or so 02/04/2017    Starr Lake PT, DPT, LAT, ATC  01/02/18  10:24 AM      [facility] [address] [place], [place], [address] Phone: [phone]   Fax:  [phone]  Name: Savannah Mendez MRN: [id number] Date of Birth: 10/31/1955

## 2018-01-06 ENCOUNTER — Encounter: Payer: Self-pay | Admitting: Physical Therapy

## 2018-01-06 ENCOUNTER — Ambulatory Visit: Payer: BC Managed Care – PPO | Admitting: Physical Therapy

## 2018-01-06 DIAGNOSIS — M25661 Stiffness of right knee, not elsewhere classified: Secondary | ICD-10-CM

## 2018-01-06 DIAGNOSIS — G8929 Other chronic pain: Secondary | ICD-10-CM

## 2018-01-06 DIAGNOSIS — M25561 Pain in right knee: Secondary | ICD-10-CM | POA: Diagnosis not present

## 2018-01-06 DIAGNOSIS — R600 Localized edema: Secondary | ICD-10-CM

## 2018-01-06 DIAGNOSIS — R269 Unspecified abnormalities of gait and mobility: Secondary | ICD-10-CM

## 2018-01-06 DIAGNOSIS — M6281 Muscle weakness (generalized): Secondary | ICD-10-CM

## 2018-01-06 NOTE — Therapy (Signed)
[place], [place], [address] Phone: [phone]   Fax:  [phone]  Physical Therapy Treatment  Patient Details  Name: Savannah Mendez MRN: [id number] Date of Birth: July 01, 1955 Referring Provider (PT): Mellody Dance MD   Encounter Date: 01/06/2018  PT End of Session - 01/06/18 1104    Visit Number  4    Number of Visits  13    Date for PT Re-Evaluation  01/28/18    PT Start Time  1100    PT Stop Time  1145    PT Time Calculation (min)  45 min       Past Medical History:  Diagnosis Date  . Allergy    to medication  . High cholesterol   . Hypercholesterolemia   . Hypertension   . Mixed hyperlipidemia   . Postmenopausal   . Pure hypercholesterolemia   . Thyroid nodule   . Tobacco abuse     Past Surgical History:  Procedure Laterality Date  . CESAREAN SECTION      There were no vitals filed for this visit.  Subjective Assessment - 01/06/18 1102    Subjective  Feels heavy especially with the rain.     Currently in Pain?  Yes    Pain Score  4     Pain Location  Knee    Pain Orientation  Right;Medial;Lateral    Pain Descriptors / Indicators  Aching   heavy   Pain Type  Chronic pain                       [facility] Adult PT Treatment/Exercise - 01/06/18 0001      Knee/Hip Exercises: Stretches   Gastroc Stretch  2 reps;30 seconds;Both   slant board     Knee/Hip Exercises: Standing   Hip Abduction  2 sets;15 reps;Knee straight    Stairs  6 inch forward and lateral step ups x 10 each focusing knee over toes    repeated after tape applied with min relief reported     Knee/Hip Exercises: Seated   Sit to Sand  10 reps   x 2 sets with ball between the knees     Knee/Hip Exercises: Supine   Quad Sets  10 reps;3 sets    Quad Sets Limitations  with and without ball squeeze     Short Arc Quad Sets  2 sets;10 reps   with ball squeeze for VMO facilitation   Bridges  10 reps    with ball squeeze   Straight Leg Raise with External Rotation  Right   2 x 15     Knee/Hip Exercises: Sidelying   Hip ABduction  15 reps;Right    Clams  Reverse clam 10 x 2      Manual Therapy   Manual Therapy  Taping    McConnell  Trial of lateral tracking tape  with some slight relief noted on stairs after spplication                PT Short Term Goals - 12/17/17 1154      PT SHORT TERM GOAL #1   Title  pt to be I with intial HEP    Time  3    Period  Weeks    Status  New    Target Date  01/07/18      PT SHORT TERM GOAL #2   Title  pt to verbalize and demo proper posture and gait and  stair mechanics to prevent and reduce R knee pain    Time  3    Period  Weeks    Status  New    Target Date  01/07/18        PT Long Term Goals - 12/17/17 1203      PT LONG TERM GOAL #1   Title  pt to increase knee extension to </= -2 degrees to promote proper gait and stair mechanics with </= 1/10 pain     Time  6    Period  Weeks    Status  New    Target Date  01/28/18      PT LONG TERM GOAL #2   Title  increase bil hip abd strength to >/= 4+/5 to promote knee stability with walking/ standing activities     Time  6    Period  Weeks    Status  New    Target Date  01/28/18      PT LONG TERM GOAL #3   Title  pt to be able to naviage up/down >/= 15 steps reciprocally with </= 1/10 pain in the R knee and no report of feeling like its going to buckle    Time  6    Period  Weeks    Status  New    Target Date  01/28/18      PT LONG TERM GOAL #4   Title  increase FOTO score to </= 38% limited to demo improvement in function     Time  6    Period  Weeks    Status  New    Target Date  01/28/18      PT LONG TERM GOAL #5   Title  pt to be I with inital HEP given as of last visit to maintain and progress current level of function     Time  6    Period  Weeks    Status  New    Target Date  01/28/18            Plan - 01/06/18 1122    Clinical Impression  Statement  Pt reports less intense pain levels and improved pain with stairs and sleep positioning. Continued with Vmo activation exercises. Pt not sure about TPDN. Trial of patella tracking tape with minimal pain releif noted with step ups.     PT Next Visit Plan   assess tape; update HEP, PFPS: STW vastus lateralis, VMO activation, hip strengthening abductors and external rotators, gait and stair training    PT Home Exercise Plan  SAQ with VMO activation, reverse clamshell, sidelying hip abduction, standing hip abduction     Consulted and Agree with Plan of Care  Patient       Patient will benefit from skilled therapeutic intervention in order to improve the following deficits and impairments:  Abnormal gait, Pain, Increased fascial restricitons, Decreased strength, Decreased activity tolerance, Decreased endurance, Decreased balance, Increased edema, Decreased range of motion  Visit Diagnosis: Chronic pain of right knee  Stiffness of right knee, not elsewhere classified  Localized edema  Muscle weakness (generalized)  Abnormal gait     Problem List Patient Active Problem List   Diagnosis Date Noted  . Glucose intolerance (impaired glucose tolerance) 03/27/2017  . Statin intolerance- only able to tolerate small dose 03/27/2017  . Chronic pain of right knee 03/27/2017  . Hypertension 02/04/2017  . HLD (hyperlipidemia) 02/04/2017  . Adjustment disorder with mixed anxiety and depressed mood 02/04/2017  .  Postmenopausal- 2008 02/04/2017  . S/P colonoscopy- 2010- N 02/04/2017  . h/o Thyroid nodule- R s/p Bx 2016 or so 02/04/2017    Dorene Ar, PTA 01/06/2018, 12:19 PM  [facility] [address] [place], [place], [address] Phone: [phone]   Fax:  [phone]  Name: Neyah Ellerman MRN: [id number] Date of Birth: 03/25/1955

## 2018-01-07 ENCOUNTER — Ambulatory Visit: Payer: BC Managed Care – PPO | Admitting: Family Medicine

## 2018-01-09 ENCOUNTER — Ambulatory Visit: Payer: BC Managed Care – PPO | Admitting: Physical Therapy

## 2018-01-09 ENCOUNTER — Encounter: Payer: Self-pay | Admitting: Physical Therapy

## 2018-01-09 DIAGNOSIS — M6281 Muscle weakness (generalized): Secondary | ICD-10-CM

## 2018-01-09 DIAGNOSIS — M25661 Stiffness of right knee, not elsewhere classified: Secondary | ICD-10-CM

## 2018-01-09 DIAGNOSIS — R600 Localized edema: Secondary | ICD-10-CM

## 2018-01-09 DIAGNOSIS — M25561 Pain in right knee: Principal | ICD-10-CM

## 2018-01-09 DIAGNOSIS — G8929 Other chronic pain: Secondary | ICD-10-CM

## 2018-01-09 DIAGNOSIS — R269 Unspecified abnormalities of gait and mobility: Secondary | ICD-10-CM

## 2018-01-09 NOTE — Therapy (Signed)
[place], [place], [address] Phone: [phone]   Fax:  [phone]  Physical Therapy Treatment  Patient Details  Name: Savannah Mendez MRN: [id number] Date of Birth: 09-Jun-1955 Referring Provider (PT): Mellody Dance MD   Encounter Date: 01/09/2018  PT End of Session - 01/09/18 1020    Visit Number  5    Number of Visits  13    Date for PT Re-Evaluation  01/28/18    PT Start Time  1020    PT Stop Time  1102    PT Time Calculation (min)  42 min    Activity Tolerance  Patient tolerated treatment well    Behavior During Therapy  [facility] for tasks assessed/performed       Past Medical History:  Diagnosis Date  . Allergy    to medication  . High cholesterol   . Hypercholesterolemia   . Hypertension   . Mixed hyperlipidemia   . Postmenopausal   . Pure hypercholesterolemia   . Thyroid nodule   . Tobacco abuse     Past Surgical History:  Procedure Laterality Date  . CESAREAN SECTION      There were no vitals filed for this visit.  Subjective Assessment - 01/09/18 1020    Subjective  "I am doing pretty good today, the taping I wasn't sure if it made difference"    Patient Stated Goals  decrease pain    Currently in Pain?  Yes    Pain Score  4     Pain Location  Knee    Pain Orientation  Right;Medial    Pain Descriptors / Indicators  Aching    Pain Type  Chronic pain    Pain Onset  More than a month ago    Pain Frequency  Intermittent                       [facility] Adult PT Treatment/Exercise - 01/09/18 0001      Self-Care   Self-Care  Other Self-Care Comments    Other Self-Care Comments   using a roller along the outsdie of the thigh to promote muscle relaxation      Exercises   Exercises  Knee/Hip      Knee/Hip Exercises: Public affairs consultant  2 reps;Right;30 seconds   prone with heel going toward oppostie hip     Knee/Hip Exercises: Seated   Sit to [employer]  10  reps;without UE support   with green band around the knees     Knee/Hip Exercises: Sidelying   Other Sidelying Knee/Hip Exercises  R sidelying: R hip external rotation 2 x 10      Manual Therapy   Manual therapy comments  MTPR along the vastus lateralis x 3, massage roller  to thigh              PT Education - 01/09/18 1102    Education Details  updated HEP for hip external rotation and sit to stand with band. benefits of focusing on motion versus pain or limitations to promote function     Person(s) Educated  Patient    Methods  Explanation;Verbal cues;Handout    Comprehension  Verbalized understanding;Verbal cues required       PT Short Term Goals - 12/17/17 1154      PT SHORT TERM GOAL #1   Title  pt to be I with intial HEP    Time  3    Period  Weeks    Status  New    Target Date  01/07/18      PT SHORT TERM GOAL #2   Title  pt to verbalize and demo proper posture and gait and stair mechanics to prevent and reduce R knee pain    Time  3    Period  Weeks    Status  New    Target Date  01/07/18        PT Long Term Goals - 12/17/17 1203      PT LONG TERM GOAL #1   Title  pt to increase knee extension to </= -2 degrees to promote proper gait and stair mechanics with </= 1/10 pain     Time  6    Period  Weeks    Status  New    Target Date  01/28/18      PT LONG TERM GOAL #2   Title  increase bil hip abd strength to >/= 4+/5 to promote knee stability with walking/ standing activities     Time  6    Period  Weeks    Status  New    Target Date  01/28/18      PT LONG TERM GOAL #3   Title  pt to be able to naviage up/down >/= 15 steps reciprocally with </= 1/10 pain in the R knee and no report of feeling like its going to buckle    Time  6    Period  Weeks    Status  New    Target Date  01/28/18      PT LONG TERM GOAL #4   Title  increase FOTO score to </= 38% limited to demo improvement in function     Time  6    Period  Weeks    Status  New     Target Date  01/28/18      PT LONG TERM GOAL #5   Title  pt to be I with inital HEP given as of last visit to maintain and progress current level of function     Time  6    Period  Weeks    Status  New    Target Date  01/28/18            Plan - 01/09/18 1102    Clinical Impression Statement  pt reports conitnued soreness rated at 4/10, reported she was unsure if the tape helped or not. Discussed DN which pt opted to not have done today. continued MTPR release techniques and hip strengthening to promote proper mechanics of the patellofemoral joint with standing/ walking.     Rehab Potential  Good    PT Next Visit Plan   update HEP, PFPS: STW vastus lateralis, VMO activation, hip strengthening abductors and external rotators, gait and stair training    PT Home Exercise Plan  SAQ with VMO activation, reverse clamshell, sidelying hip abduction, standing hip abduction     Consulted and Agree with Plan of Care  Patient       Patient will benefit from skilled therapeutic intervention in order to improve the following deficits and impairments:  Abnormal gait, Pain, Increased fascial restricitons, Decreased strength, Decreased activity tolerance, Decreased endurance, Decreased balance, Increased edema, Decreased range of motion  Visit Diagnosis: Chronic pain of right knee  Stiffness of right knee, not elsewhere classified  Localized edema  Muscle weakness (generalized)  Abnormal gait     Problem List Patient Active Problem List  Diagnosis Date Noted  . Glucose intolerance (impaired glucose tolerance) 03/27/2017  . Statin intolerance- only able to tolerate small dose 03/27/2017  . Chronic pain of right knee 03/27/2017  . Hypertension 02/04/2017  . HLD (hyperlipidemia) 02/04/2017  . Adjustment disorder with mixed anxiety and depressed mood 02/04/2017  . Postmenopausal- 2008 02/04/2017  . S/P colonoscopy- 2010- N 02/04/2017  . h/o Thyroid nodule- R s/p Bx 2016 or so  02/04/2017   Starr Lake PT, DPT, LAT, ATC  01/09/18  11:07 AM      [facility] [address] [place], [place], [address] Phone: [phone]   Fax:  [phone]  Name: Savannah Mendez MRN: [id number] Date of Birth: 09/24/1955

## 2018-01-13 ENCOUNTER — Ambulatory Visit: Payer: BC Managed Care – PPO | Admitting: Physical Therapy

## 2018-01-13 ENCOUNTER — Encounter: Payer: Self-pay | Admitting: Physical Therapy

## 2018-01-13 DIAGNOSIS — R269 Unspecified abnormalities of gait and mobility: Secondary | ICD-10-CM

## 2018-01-13 DIAGNOSIS — M25561 Pain in right knee: Secondary | ICD-10-CM | POA: Diagnosis not present

## 2018-01-13 DIAGNOSIS — M25661 Stiffness of right knee, not elsewhere classified: Secondary | ICD-10-CM

## 2018-01-13 DIAGNOSIS — R600 Localized edema: Secondary | ICD-10-CM

## 2018-01-13 DIAGNOSIS — M6281 Muscle weakness (generalized): Secondary | ICD-10-CM

## 2018-01-13 DIAGNOSIS — G8929 Other chronic pain: Secondary | ICD-10-CM

## 2018-01-13 NOTE — Therapy (Signed)
[place], [place], [address] Phone: [phone]   Fax:  [phone]  Physical Therapy Treatment  Patient Details  Name: Savannah Mendez MRN: [id number] Date of Birth: 09-Nov-1955 Referring Provider (PT): Mellody Dance MD   Encounter Date: 01/13/2018  PT End of Session - 01/13/18 1102    Visit Number  6    Number of Visits  13    Date for PT Re-Evaluation  01/28/18    PT Start Time  1102    PT Stop Time  1142    PT Time Calculation (min)  40 min    Activity Tolerance  Patient tolerated treatment well    Behavior During Therapy  [facility] for tasks assessed/performed       Past Medical History:  Diagnosis Date  . Allergy    to medication  . High cholesterol   . Hypercholesterolemia   . Hypertension   . Mixed hyperlipidemia   . Postmenopausal   . Pure hypercholesterolemia   . Thyroid nodule   . Tobacco abuse     Past Surgical History:  Procedure Laterality Date  . CESAREAN SECTION      There were no vitals filed for this visit.  Subjective Assessment - 01/13/18 1104    Subjective  "I am saying the pain is about 3/10 and which I am surprised with the yard work I did"     Currently in Pain?  Yes    Pain Score  3     Aggravating Factors   standing/ walking for long periods, stairs    Pain Relieving Factors  medication, ice                       [facility] Adult PT Treatment/Exercise - 01/13/18 1106      Knee/Hip Exercises: Stretches   Quad Stretch  2 reps;Right;30 seconds    Gastroc Stretch  2 reps;30 seconds;Both   slant board     Knee/Hip Exercises: Aerobic   Elliptical  L 4 x 5 min       Knee/Hip Exercises: Machines for Strengthening   Cybex Knee Extension  2 x 10 20#, bil with controlled eccentric lowering x 5 secs ea. rep      Knee/Hip Exercises: Standing   SLS with Vectors  bil with red theraband 1 x 15 with flexion/ abduction and extension      Manual Therapy   Manual Therapy  Soft tissue mobilization    Soft tissue mobilization  IASTM along the patellar tendon and quad tendon          Balance Exercises - 01/13/18 1147      Balance Exercises: Standing   Tandem Stance  Eyes open;Eyes closed;4 reps;30 secs   switching lead leg, mod postural sway with eyes closed       PT Education - 01/13/18 1143    Education Details  updated HEP for corner tandem balance    Person(s) Educated  Patient    Methods  Explanation;Verbal cues    Comprehension  Verbalized understanding;Verbal cues required       PT Short Term Goals - 01/13/18 1145      PT SHORT TERM GOAL #1   Title  pt to be I with intial HEP    Time  3    Period  Weeks      PT SHORT TERM GOAL #2   Title  pt to verbalize and demo proper posture and gait and  stair mechanics to prevent and reduce R knee pain    Time  3    Period  Weeks    Status  Achieved        PT Long Term Goals - 01/13/18 1145      PT LONG TERM GOAL #1   Title  pt to increase knee extension to </= -2 degrees to promote proper gait and stair mechanics with </= 1/10 pain     Time  6    Period  Weeks    Status  On-going      PT LONG TERM GOAL #2   Title  increase bil hip abd strength to >/= 4+/5 to promote knee stability with walking/ standing activities     Time  6    Period  Weeks    Status  On-going      PT LONG TERM GOAL #3   Title  pt to be able to naviage up/down >/= 15 steps reciprocally with </= 1/10 pain in the R knee and no report of feeling like its going to buckle    Time  6    Period  Weeks    Status  On-going      PT LONG TERM GOAL #4   Title  increase FOTO score to </= 38% limited to demo improvement in function     Time  6    Period  Weeks    Status  Unable to assess      PT LONG TERM GOAL #5   Title  pt to be I with inital HEP given as of last visit to maintain and progress current level of function     Time  6    Period  Weeks    Status  On-going            Plan -  01/13/18 1144    Clinical Impression Statement  pt reports 3/10 pain today . continued working on strengthening of the hip/ knee with emphasis on endurance training. began balance training which she perofrmed well with moderate postural sway in tandem with eyes open/ closed. updated HEP for standing corner balance.     PT Next Visit Plan   update HEP, PFPS: STW vastus lateralis, VMO activation, hip strengthening abductors and external rotators, gait and stair training    PT Home Exercise Plan  SAQ with VMO activation, reverse clamshell, sidelying hip abduction, standing hip abduction, corner tandem balance    Consulted and Agree with Plan of Care  Patient       Patient will benefit from skilled therapeutic intervention in order to improve the following deficits and impairments:  Abnormal gait, Pain, Increased fascial restricitons, Decreased strength, Decreased activity tolerance, Decreased endurance, Decreased balance, Increased edema, Decreased range of motion  Visit Diagnosis: Stiffness of right knee, not elsewhere classified  Localized edema  Chronic pain of right knee  Muscle weakness (generalized)  Abnormal gait     Problem List Patient Active Problem List   Diagnosis Date Noted  . Glucose intolerance (impaired glucose tolerance) 03/27/2017  . Statin intolerance- only able to tolerate small dose 03/27/2017  . Chronic pain of right knee 03/27/2017  . Hypertension 02/04/2017  . HLD (hyperlipidemia) 02/04/2017  . Adjustment disorder with mixed anxiety and depressed mood 02/04/2017  . Postmenopausal- 2008 02/04/2017  . S/P colonoscopy- 2010- N 02/04/2017  . h/o Thyroid nodule- R s/p Bx 2016 or so 02/04/2017   Sandeep Delagarza PT, DPT, LAT, ATC  01/13/18  11:50  AM      [facility] [address] [place], [place], [address] Phone: [phone]   Fax:  [phone]  Name: Savannah Mendez MRN: [id number] Date of Birth:  09/19/1955

## 2018-01-16 ENCOUNTER — Encounter: Payer: Self-pay | Admitting: Physical Therapy

## 2018-01-16 ENCOUNTER — Ambulatory Visit: Payer: BC Managed Care – PPO | Attending: Family Medicine | Admitting: Physical Therapy

## 2018-01-16 DIAGNOSIS — M25561 Pain in right knee: Secondary | ICD-10-CM | POA: Diagnosis present

## 2018-01-16 DIAGNOSIS — R269 Unspecified abnormalities of gait and mobility: Secondary | ICD-10-CM | POA: Diagnosis present

## 2018-01-16 DIAGNOSIS — M25661 Stiffness of right knee, not elsewhere classified: Secondary | ICD-10-CM | POA: Insufficient documentation

## 2018-01-16 DIAGNOSIS — G8929 Other chronic pain: Secondary | ICD-10-CM | POA: Insufficient documentation

## 2018-01-16 DIAGNOSIS — M6281 Muscle weakness (generalized): Secondary | ICD-10-CM | POA: Insufficient documentation

## 2018-01-16 DIAGNOSIS — R600 Localized edema: Secondary | ICD-10-CM | POA: Insufficient documentation

## 2018-01-16 NOTE — Therapy (Signed)
[place], [place], [address] Phone: [phone]   Fax:  [phone]  Physical Therapy Treatment  Patient Details  Name: Savannah Mendez MRN: [id number] Date of Birth: 1955/12/21 Referring Provider (PT): Mellody Dance MD   Encounter Date: 01/16/2018  PT End of Session - 01/16/18 1105    Visit Number  7    Number of Visits  13    Date for PT Re-Evaluation  01/28/18    PT Start Time  0814    PT Stop Time  1140    PT Time Calculation (min)  55 min       Past Medical History:  Diagnosis Date  . Allergy    to medication  . High cholesterol   . Hypercholesterolemia   . Hypertension   . Mixed hyperlipidemia   . Postmenopausal   . Pure hypercholesterolemia   . Thyroid nodule   . Tobacco abuse     Past Surgical History:  Procedure Laterality Date  . CESAREAN SECTION      There were no vitals filed for this visit.  Subjective Assessment - 01/16/18 1048    Subjective  Going okay. I can tell the knee is creaking when I bend it.  I have pain going down the stairs.     Currently in Pain?  Yes    Pain Score  3     Pain Location  Knee    Pain Orientation  Right    Pain Descriptors / Indicators  Aching                       [facility] Adult PT Treatment/Exercise - 01/16/18 0001      Exercises   Exercises  Knee/Hip      Knee/Hip Exercises: Stretches   Active Hamstring Stretch  3 reps;30 seconds   strap and overpressure on thigh   Quad Stretch  3 reps;20 seconds    Quad Stretch Limitations  standing     Gastroc Stretch  2 reps;30 seconds;Both   slant board     Knee/Hip Exercises: Aerobic   Elliptical  L 4 x 5 min       Knee/Hip Exercises: Machines for Strengthening   Cybex Knee Extension  2 x 10 10#, bil with controlled eccentric lowering x 5 secs ea. rep   bil 5# concentric and RLE eccentric x 10      Knee/Hip Exercises: Standing   SLS with Vectors  bil with red theraband 1 x  15 with flexion/ abduction and extension    Other Standing Knee Exercises  tandem balance at counter       Knee/Hip Exercises: Supine   Quad Sets  20 reps    Quad Sets Limitations  heel prop     Heel Slides  10 reps      Modalities   Modalities  Cryotherapy      Cryotherapy   Number Minutes Cryotherapy  10 Minutes    Cryotherapy Location  Knee    Type of Cryotherapy  Ice pack               PT Short Term Goals - 01/13/18 1145      PT SHORT TERM GOAL #1   Title  pt to be I with intial HEP    Time  3    Period  Weeks      PT SHORT TERM GOAL #2   Title  pt to verbalize and demo  proper posture and gait and stair mechanics to prevent and reduce R knee pain    Time  3    Period  Weeks    Status  Achieved        PT Long Term Goals - 01/13/18 1145      PT LONG TERM GOAL #1   Title  pt to increase knee extension to </= -2 degrees to promote proper gait and stair mechanics with </= 1/10 pain     Time  6    Period  Weeks    Status  On-going      PT LONG TERM GOAL #2   Title  increase bil hip abd strength to >/= 4+/5 to promote knee stability with walking/ standing activities     Time  6    Period  Weeks    Status  On-going      PT LONG TERM GOAL #3   Title  pt to be able to naviage up/down >/= 15 steps reciprocally with </= 1/10 pain in the R knee and no report of feeling like its going to buckle    Time  6    Period  Weeks    Status  On-going      PT LONG TERM GOAL #4   Title  increase FOTO score to </= 38% limited to demo improvement in function     Time  6    Period  Weeks    Status  Unable to assess      PT LONG TERM GOAL #5   Title  pt to be I with inital HEP given as of last visit to maintain and progress current level of function     Time  6    Period  Weeks    Status  On-going            Plan - 01/16/18 1122    Clinical Impression Statement  Pt continues with 3/10 pain and lacks full knee extension. Worked on terminal knee extension and  knee stretching. Knee extension AROM/PROm improves afterward. Continued with 3 way hip and tandem balance with good tolerance. Ice at end of session to decrease soreness.     PT Next Visit Plan   update HEP, PFPS: STW vastus lateralis, VMO activation, hip strengthening abductors and external rotators, gait and stair training    PT Home Exercise Plan  SAQ with VMO activation, reverse clamshell, sidelying hip abduction, standing hip abduction, corner tandem balance    Consulted and Agree with Plan of Care  Patient       Patient will benefit from skilled therapeutic intervention in order to improve the following deficits and impairments:  Abnormal gait, Pain, Increased fascial restricitons, Decreased strength, Decreased activity tolerance, Decreased endurance, Decreased balance, Increased edema, Decreased range of motion  Visit Diagnosis: Stiffness of right knee, not elsewhere classified  Localized edema  Chronic pain of right knee  Muscle weakness (generalized)  Abnormal gait     Problem List Patient Active Problem List   Diagnosis Date Noted  . Glucose intolerance (impaired glucose tolerance) 03/27/2017  . Statin intolerance- only able to tolerate small dose 03/27/2017  . Chronic pain of right knee 03/27/2017  . Hypertension 02/04/2017  . HLD (hyperlipidemia) 02/04/2017  . Adjustment disorder with mixed anxiety and depressed mood 02/04/2017  . Postmenopausal- 2008 02/04/2017  . S/P colonoscopy- 2010- N 02/04/2017  . h/o Thyroid nodule- R s/p Bx 2016 or so 02/04/2017    Dorene Ar, PTA  01/16/2018, 11:35 AM  [place], [place], [address] Phone: [phone]   Fax:  [phone]  Name: Babbette Dalesandro MRN: [id number] Date of Birth: 08-17-1955

## 2018-01-20 ENCOUNTER — Ambulatory Visit: Payer: BC Managed Care – PPO | Admitting: Physical Therapy

## 2018-01-20 ENCOUNTER — Encounter: Payer: Self-pay | Admitting: Physical Therapy

## 2018-01-20 DIAGNOSIS — R269 Unspecified abnormalities of gait and mobility: Secondary | ICD-10-CM

## 2018-01-20 DIAGNOSIS — G8929 Other chronic pain: Secondary | ICD-10-CM

## 2018-01-20 DIAGNOSIS — M25561 Pain in right knee: Secondary | ICD-10-CM

## 2018-01-20 DIAGNOSIS — R600 Localized edema: Secondary | ICD-10-CM

## 2018-01-20 DIAGNOSIS — M25661 Stiffness of right knee, not elsewhere classified: Secondary | ICD-10-CM

## 2018-01-20 DIAGNOSIS — M6281 Muscle weakness (generalized): Secondary | ICD-10-CM

## 2018-01-20 NOTE — Therapy (Signed)
[facility], [place], [address] Phone: [phone]   Fax:  [phone]  Physical Therapy Treatment  Patient Details  Name: Savannah Mendez MRN: [id number] Date of Birth: [birth year] Referring Provider (PT): Mellody Dance MD   Encounter Date: 01/20/2018  PT End of Session - 01/20/18 1020    Visit Number  8    Number of Visits  13    Date for PT Re-Evaluation  01/28/18    PT Start Time  6301    PT Stop Time  1100    PT Time Calculation (min)  45 min       Past Medical History:  Diagnosis Date  . Allergy    to medication  . High cholesterol   . Hypercholesterolemia   . Hypertension   . Mixed hyperlipidemia   . Postmenopausal   . Pure hypercholesterolemia   . Thyroid nodule   . Tobacco abuse     Past Surgical History:  Procedure Laterality Date  . CESAREAN SECTION      There were no vitals filed for this visit.                    [facility] Adult PT Treatment/Exercise - 01/20/18 0001      Self-Care   Self-Care  ADL's    ADL's  ADL modification based on pain lvel, continue to be active, ice as a preventative, osteoarthritis and how to manage progression, staying strong and flexible in hip/ankle/knee to assist knee joint, chronic pain       Therapeutic Activites    Therapeutic Activities  ADL's    ADL's  kneeling to pillow under right knee and using edge of mat to assit with UE and rise back up x 2       Exercises   Exercises  Knee/Hip      Knee/Hip Exercises: Stretches   Active Hamstring Stretch  3 reps;30 seconds   strap and overpressure on thigh   Active Hamstring Stretch Limitations  various positions       Knee/Hip Exercises: Standing   Forward Step Up  15 reps      Knee/Hip Exercises: Seated   Long Arc Quad Limitations  with ball squeeze x 20     Other Seated Knee/Hip Exercises  quad set seated and long sitting     Sit to Sand  10 reps;without UE support      Knee/Hip Exercises: Supine   Quad Sets  20 reps    Quad Sets Limitations  w/ and w/o heel prop    Short Arc Quad Sets  20 reps    Bridges  20 reps   ball squeeze               PT Short Term Goals - 01/13/18 1145      PT SHORT TERM GOAL #1   Title  pt to be I with intial HEP    Time  3    Period  Weeks      PT SHORT TERM GOAL #2   Title  pt to verbalize and demo proper posture and gait and stair mechanics to prevent and reduce R knee pain    Time  3    Period  Weeks    Status  Achieved        PT Long Term Goals - 01/13/18 1145      PT LONG TERM GOAL #1   Title  pt to increase knee extension  to </= -2 degrees to promote proper gait and stair mechanics with </= 1/10 pain     Time  6    Period  Weeks    Status  On-going      PT LONG TERM GOAL #2   Title  increase bil hip abd strength to >/= 4+/5 to promote knee stability with walking/ standing activities     Time  6    Period  Weeks    Status  On-going      PT LONG TERM GOAL #3   Title  pt to be able to naviage up/down >/= 15 steps reciprocally with </= 1/10 pain in the R knee and no report of feeling like its going to buckle    Time  6    Period  Weeks    Status  On-going      PT LONG TERM GOAL #4   Title  increase FOTO score to </= 38% limited to demo improvement in function     Time  6    Period  Weeks    Status  Unable to assess      PT LONG TERM GOAL #5   Title  pt to be I with inital HEP given as of last visit to maintain and progress current level of function     Time  6    Period  Weeks    Status  On-going            Plan - 01/20/18 1028    Clinical Impression Statement  Pt reports knee does not hurt as much as at eval however the pain is still there. The pain intensity is less however the frequency is the same. She admits to somewhat consistent HEP. Her end of POC is approaching however she is wants to see more improvement to avoid future knee replacement. Education provided on management  of osteoarthritis and pain exacerbations are to be expected. Worked on getting up off floor using pillow under right knee which was much better than her current way. Reviewed her HEP and worked on step ups with eccentric control back down. Possible discharge next visit. Need to finalize HEP and perform FOTO if needed.     PT Next Visit Plan   FOTO; check goals, finalize HEP, PFPS: STW vastus lateralis, VMO activation, hip strengthening abductors and external rotators, gait and stair training add step up to [place] with VMO activation, reverse clamshell, sidelying hip abduction, standing hip abduction, corner tandem balance    Consulted and Agree with Plan of Care  Patient       Patient will benefit from skilled therapeutic intervention in order to improve the following deficits and impairments:  Abnormal gait, Pain, Increased fascial restricitons, Decreased strength, Decreased activity tolerance, Decreased endurance, Decreased balance, Increased edema, Decreased range of motion  Visit Diagnosis: Stiffness of right knee, not elsewhere classified  Localized edema  Chronic pain of right knee  Muscle weakness (generalized)  Abnormal gait     Problem List Patient Active Problem List   Diagnosis Date Noted  . Glucose intolerance (impaired glucose tolerance) 03/27/2017  . Statin intolerance- only able to tolerate small dose 03/27/2017  . Chronic pain of right knee 03/27/2017  . Hypertension 02/04/2017  . HLD (hyperlipidemia) 02/04/2017  . Adjustment disorder with mixed anxiety and depressed mood 02/04/2017  . Postmenopausal- 2008 02/04/2017  . S/P colonoscopy- 2010- N 02/04/2017  . h/o Thyroid nodule- R  s/p Bx 2016 or so 02/04/2017    Dorene Ar, PTA 01/20/2018, [place], [place], [address] Phone: [phone]   Fax:  [phone]  Name: Savannah Mendez MRN:  [id number] Date of Birth: 1955-08-22

## 2018-01-23 ENCOUNTER — Ambulatory Visit: Payer: BC Managed Care – PPO | Admitting: Physical Therapy

## 2018-01-23 ENCOUNTER — Encounter: Payer: Self-pay | Admitting: Physical Therapy

## 2018-01-23 DIAGNOSIS — R269 Unspecified abnormalities of gait and mobility: Secondary | ICD-10-CM

## 2018-01-23 DIAGNOSIS — M25661 Stiffness of right knee, not elsewhere classified: Secondary | ICD-10-CM | POA: Diagnosis not present

## 2018-01-23 DIAGNOSIS — M25561 Pain in right knee: Secondary | ICD-10-CM

## 2018-01-23 DIAGNOSIS — G8929 Other chronic pain: Secondary | ICD-10-CM

## 2018-01-23 DIAGNOSIS — M6281 Muscle weakness (generalized): Secondary | ICD-10-CM

## 2018-01-23 DIAGNOSIS — R600 Localized edema: Secondary | ICD-10-CM

## 2018-01-23 NOTE — Therapy (Addendum)
[place], [place], [address] Phone: [phone]   Fax:  [phone]  Physical Therapy Treatment / Discharge Summary  Patient Details  Name: Savannah Mendez MRN: [id number] Date of Birth: [birth year] Referring Provider (PT): Mellody Dance MD   Encounter Date: 01/23/2018  PT End of Session - 01/23/18 1009    Visit Number  9    Number of Visits  13    Date for PT Re-Evaluation  01/28/18    PT Start Time  1010    PT Stop Time  1100    PT Time Calculation (min)  50 min       Past Medical History:  Diagnosis Date  . Allergy    to medication  . High cholesterol   . Hypercholesterolemia   . Hypertension   . Mixed hyperlipidemia   . Postmenopausal   . Pure hypercholesterolemia   . Thyroid nodule   . Tobacco abuse     Past Surgical History:  Procedure Laterality Date  . CESAREAN SECTION      There were no vitals filed for this visit.  Subjective Assessment - 01/23/18 1012    Subjective  Did my exercises, forgot to try using a pillow under knee to get up from the floor. The knee pain is still there. I have been icing it.      Currently in Pain?  No/denies    Aggravating Factors   walking, standing, stairs     Pain Relieving Factors  meds, ice          [facility] PT Assessment - 01/23/18 0001      Observation/Other Assessments   Focus on Therapeutic Outcomes (FOTO)   39% limited       AROM   Right Knee Extension  -4    Right Knee Flexion  130      PROM   Right Knee Extension  0      Strength   Right Hip ABduction  4+/5    Left Hip ABduction  4+/5                   [facility] Adult PT Treatment/Exercise - 01/23/18 0001      Exercises   Exercises  Knee/Hip      Knee/Hip Exercises: Stretches   Active Hamstring Stretch  3 reps;30 seconds   strap and overpressure on thigh   Active Hamstring Stretch Limitations  various positions     Quad Stretch Limitations  sidelying       Knee/Hip Exercises: Aerobic   Elliptical  L3 , Ramp 3  x 5 min       Knee/Hip Exercises: Machines for Strengthening   Cybex Knee Extension  2 x 10 5#, bil with RLE controlled eccentric lowering x 5 secs ea. rep    Cybex Knee Flexion  15# bilateral concentric and RLE eccentric x 20     Total Gym Leg Press  35# x 20 with quad set to extend      Knee/Hip Exercises: Standing   Forward Step Up  10 reps;Step Height: 6"    Step Down  Step Height: 4";Hand Hold: 1;1 set    Functional Squat  10 reps    Functional Squat Limitations  with RLE back    Other Standing Knee Exercises  tandem balance at counter       Knee/Hip Exercises: Seated   Long Arc Quad Limitations  with ball squeeze x 20     Other  Seated Knee/Hip Exercises  quad set seated and long sitting                PT Short Term Goals - 01/23/18 1054      PT SHORT TERM GOAL #1   Title  pt to be I with intial HEP    Time  3    Period  Weeks    Status  Achieved      PT SHORT TERM GOAL #2   Title  pt to verbalize and demo proper posture and gait and stair mechanics to prevent and reduce R knee pain    Time  3    Period  Weeks    Status  Achieved        PT Long Term Goals - 01/23/18 1054      PT LONG TERM GOAL #1   Title  pt to increase knee extension to </= -2 degrees to promote proper gait and stair mechanics with </= 1/10 pain     Baseline  -4    Time  6    Period  Weeks    Status  On-going      PT LONG TERM GOAL #2   Title  increase bil hip abd strength to >/= 4+/5 to promote knee stability with walking/ standing activities     Time  6    Period  Weeks    Status  Achieved      PT LONG TERM GOAL #3   Title  pt to be able to naviage up/down >/= 15 steps reciprocally with </= 1/10 pain in the R knee and no report of feeling like its going to buckle    Baseline  3/10 average with fear of buckle    Time  6    Period  Weeks    Status  On-going      PT LONG TERM GOAL #4   Title  increase FOTO score to </= 38%  limited to demo improvement in function     Baseline  39%    Time  6    Period  Weeks    Status  On-going      PT LONG TERM GOAL #5   Title  pt to be I with inital HEP given as of last visit to maintain and progress current level of function     Time  6    Period  Weeks    Status  On-going            Plan - 01/23/18 1113    Clinical Impression Statement  Improved tolerance to machines and closed chain today. Good motor control with step downs. FOTO score improved to 39% limited. SLS 30+ . Hip strength improved bilat.     PT Next Visit Plan  Dc Cathie Beams renewal -  finalize HEP, PFPS: STW vastus lateralis, VMO activation, hip strengthening abductors and external rotators, gait and stair training add step up to HEP     PT Home Exercise Plan  SAQ with VMO activation, reverse clamshell, sidelying hip abduction, standing hip abduction, corner tandem balance    Consulted and Agree with Plan of Care  Patient       Patient will benefit from skilled therapeutic intervention in order to improve the following deficits and impairments:  Abnormal gait, Pain, Increased fascial restricitons, Decreased strength, Decreased activity tolerance, Decreased endurance, Decreased balance, Increased edema, Decreased range of motion  Visit Diagnosis: Stiffness of right knee, not elsewhere classified  Localized edema  Chronic pain of right knee  Muscle weakness (generalized)  Abnormal gait     Problem List Patient Active Problem List   Diagnosis Date Noted  . Glucose intolerance (impaired glucose tolerance) 03/27/2017  . Statin intolerance- only able to tolerate small dose 03/27/2017  . Chronic pain of right knee 03/27/2017  . Hypertension 02/04/2017  . HLD (hyperlipidemia) 02/04/2017  . Adjustment disorder with mixed anxiety and depressed mood 02/04/2017  . Postmenopausal- 2008 02/04/2017  . S/P colonoscopy- 2010- N 02/04/2017  . h/o Thyroid nodule- R s/p Bx 2016 or so 02/04/2017     Dorene Ar, PTA 01/23/2018, 11:18 AM  [place], [place], [address] Phone: [phone]   Fax:  [phone]  Name: Savannah Mendez MRN: [id number] Date of Birth: Aug 29, 1955      PHYSICAL THERAPY DISCHARGE SUMMARY  Visits from Start of Care: 9  Current functional level related to goals / functional outcomes: See goals   Remaining deficits: As of last attended visit mild pain with stairs but overall doing better   Education / Equipment: HEP  Plan: Patient agrees to discharge.  Patient goals were partially met. Patient is being discharged due to not returning since the last visit.  ?????         Kristoffer Leamon PT, DPT, LAT, ATC  02/24/18  11:16 AM

## 2018-01-27 ENCOUNTER — Other Ambulatory Visit: Payer: Self-pay | Admitting: Family Medicine

## 2018-02-02 ENCOUNTER — Other Ambulatory Visit: Payer: BC Managed Care – PPO

## 2018-02-02 ENCOUNTER — Other Ambulatory Visit: Payer: Self-pay

## 2018-02-02 DIAGNOSIS — E785 Hyperlipidemia, unspecified: Secondary | ICD-10-CM

## 2018-02-02 DIAGNOSIS — Z79899 Other long term (current) drug therapy: Secondary | ICD-10-CM

## 2018-02-03 LAB — LIPID PANEL
Chol/HDL Ratio: 3.8 ratio (ref 0.0–4.4)
Cholesterol, Total: 189 mg/dL (ref 100–199)
HDL: 50 mg/dL
LDL Calculated: 122 mg/dL — ABNORMAL HIGH (ref 0–99)
Triglycerides: 85 mg/dL (ref 0–149)
VLDL Cholesterol Cal: 17 mg/dL (ref 5–40)

## 2018-02-03 LAB — ALT: ALT: 13 [IU]/L (ref 0–32)

## 2018-02-04 ENCOUNTER — Ambulatory Visit: Payer: BC Managed Care – PPO | Admitting: Physical Therapy

## 2018-02-05 ENCOUNTER — Encounter: Payer: Self-pay | Admitting: Family Medicine

## 2018-02-05 ENCOUNTER — Ambulatory Visit: Payer: BC Managed Care – PPO | Admitting: Family Medicine

## 2018-02-05 VITALS — BP 130/89 | HR 66 | Temp 98.4°F | Ht 71.0 in | Wt 166.0 lb

## 2018-02-05 DIAGNOSIS — Z716 Tobacco abuse counseling: Secondary | ICD-10-CM

## 2018-02-05 DIAGNOSIS — Z720 Tobacco use: Secondary | ICD-10-CM

## 2018-02-05 DIAGNOSIS — Z79899 Other long term (current) drug therapy: Secondary | ICD-10-CM

## 2018-02-05 DIAGNOSIS — I10 Essential (primary) hypertension: Secondary | ICD-10-CM

## 2018-02-05 DIAGNOSIS — E782 Mixed hyperlipidemia: Secondary | ICD-10-CM

## 2018-02-05 MED ORDER — ROSUVASTATIN CALCIUM 20 MG PO TABS: 20.0000 mg | ORAL_TABLET | Freq: Every day | ORAL | 1 refills | Status: DC

## 2018-02-05 MED ORDER — AMLODIPINE BESY-BENAZEPRIL HCL 2.5-10 MG PO CAPS: 1.0000 | ORAL_CAPSULE | Freq: Every day | ORAL | 1 refills | Status: DC

## 2018-02-05 NOTE — Progress Notes (Signed)
Assessment and plan:  1. Mixed hyperlipidemia   2. Essential hypertension   3. Tobacco abuse   4. Long-term use of high-risk medication   5. Tobacco abuse counseling     1. Preventative Measures - Encouraged patient to quit smoking, control her cholesterol, and increase her diet and exercise to improve her overall health. Discussed that this is the best primary prevention to avoid incident of heart attack or stroke.  2. Hyperlipidemia - Patient has not been compliant with meds as prescribed.  Notes she's been taking her Crestor, but has not been taking the 10 mg dose as prescribed.  She has been taking 5 mg instead.  Per patient, she slacked off on taking it before.  - Patient may start by increasing her dose to 10 mg.  In future, try to increase dose to 20 mg as prescribed, with goal of 20-40 mg dose in the future.  See med list today.  - Patient tolerating meds well without complication.  Denies S-E  - LDL has decreased to 122 from 160 prior. - Triglycerides have decreased to 189 from 232 prior. - Encouraged patient to exercise to improve HDL value.  The 10-year ASCVD risk score Mikey Bussing DC Brooke Bonito., et al., 2013) is: 12%   Values used to calculate the score:     Age: 63 years     Sex: Female     Is Non-Hispanic African American: No     Diabetic: No     Tobacco smoker: Yes     Systolic Blood Pressure: 462 mmHg     Is BP treated: Yes     HDL Cholesterol: 50 mg/dL     Total Cholesterol: 189 mg/dL  - Dietary changes such as low saturated & trans fat and low carb/ ketogenic diets discussed with patient.  Encouraged regular exercise and weight loss when appropriate.   - Educational handouts provided at patient's desire.  3. Blood Pressure - Reviewed goal as less than 140/90, but ideally less.  - Lifestyle changes such as dash diet and engaging in a regular exercise program discussed with patient.  Educational  handouts provided.  - Encouraged smoking cessation with patient today.  - Ambulatory BP monitoring encouraged. Keep log and bring in next OV.  4. Smoking Cessation Told pt to think seriously about quitting smoking!  Told pt it is very important for his/her health and well being.    Smoking cessation instruction/counseling given:  counseled patient on the dangers of tobacco use, advised patient to stop smoking, and reviewed strategies to maximize success  Discussed with patient that there are multiple treatments to aid in quitting smoking, however I explained none will work unless pt really want to quit  Told to call 1-800-QUIT-NOW (508)329-7149) for free smoking cessation counseling and support, or pt can go online to www.heart.org - the American Heart Association website and search "quit smoking ".   5. Knee Pain - Encouraged patient to continue physical therapy on her knee. - Will continue to monitor.  6. BMI Counseling Explained to patient what BMI refers to, and what it means medically.    Told patient to think about it as a "medical risk stratification measurement" and how increasing BMI is associated with increasing risk/ or worsening state of various diseases such as hypertension, hyperlipidemia, diabetes, premature OA, depression etc.  American Heart Association guidelines for healthy diet, basically Mediterranean diet, and exercise guidelines of 30 minutes 5 days per week or more discussed in  detail.  Health counseling performed.  All questions answered.  7. Lifestyle & Preventative Health Maintenance - Advised patient to continue working toward exercising to improve overall mental, physical, and emotional health.    - Reviewed the "spokes of the wheel" of mood and health management.  Stressed the importance of ongoing prudent habits, including regular exercise, appropriate sleep hygiene, healthful dietary habits, and prayer/meditation to relax.  - Encouraged patient to  engage in daily physical activity, especially a formal exercise routine.  Recommended that the patient eventually strive for at least 150 minutes of moderate cardiovascular activity per week according to guidelines established by the [facility].   - Healthy dietary habits encouraged, including low-carb, and high amounts of lean protein in diet.   - Patient should also consume adequate amounts of water.   Education and routine counseling performed. Handouts provided.  8. Follow-Up - Prescriptions provided and refilled today PRN. - Re-check fasting lab work as recommended. - Otherwise, continue to return for CPE and chronic follow-up as scheduled.   - Patient knows to call in sooner if desired to address acute concerns.   Meds ordered this encounter  Medications  . amlodipine-benazepril (LOTREL) 2.5-10 MG capsule    Sig: Take 1 capsule by mouth daily.    Dispense:  90 capsule    Refill:  1  . rosuvastatin (CRESTOR) 20 MG tablet    Sig: Take 1 tablet (20 mg total) by mouth at bedtime.    Dispense:  90 tablet    Refill:  1    Medications Discontinued During This Encounter  Medication Reason  . hydrocortisone 1 % lotion Completed Course  . amlodipine-benazepril (LOTREL) 2.5-10 MG capsule Reorder  . rosuvastatin (CRESTOR) 10 MG tablet Reorder      Return for Chronic OV w me 6 mo ro so .   Anticipatory guidance and routine counseling done re: condition, txmnt options and need for follow up. All questions of patient's were answered.   Gross side effects, risk and benefits, and alternatives of medications discussed with patient.  Patient is aware that all medications have potential side effects and we are unable to predict every sideeffect or drug-drug interaction that may occur.  Expresses verbal understanding and consents to current therapy plan and treatment regiment.  Please see AVS handed out to patient at the end of our visit for additional patient instructions/ counseling done  pertaining to today's office visit.  Note:  This document was prepared using Dragon voice recognition software and may include unintentional dictation errors.  This document serves as a record of services personally performed by Mellody Dance, DO. It was created on her behalf by Toni Amend, a trained medical scribe. The creation of this record is based on the scribe's personal observations and the provider's statements to them.   I have reviewed the above medical documentation for accuracy and completeness and I concur.  Mellody Dance, DO 02/09/2018 6:50 AM      -----------------------------------------------------------------------------------  Subjective:   CC:   Savannah Mendez is a 62 y.o. female who presents to [place] [facility] today for review and discussion of recent bloodwork that was done in addition to f/up on chronic conditions we are managing for pt.  1. All recent blood work that we ordered was reviewed with patient today.  Patient was counseled on all abnormalities and we discussed dietary and lifestyle changes that could help those values (also medications when appropriate).  Extensive health counseling performed and all patient's  concerns/ questions were addressed.  See labs below and also plan for more details of these abnormalities  Did some physical therapy with her knee.  Notes "hanging in there."    Has been taking her Crestor.  Notes "slacking off" last time.  Denies any increased joint pain on Crestor.  However, she has not been taking the Crestor as prescribed.  States she's trying to drink plenty of water, "but I'm probably not drinking as much as I need to."  Ongoing Tobacco Abuse She continues smoking.  States "we're still psyching up for that."  Blood Pressure She does not check her blood pressure at home.  1. 62 y.o. female here for cholesterol follow-up.   - Patient reports so-so compliance with medications or  treatment plan.  She has been taking her Crestor, but states "I just don't like taking cholesterol medication."  She's been taking only 5 mg, instead of the 10 mg prescribed.  - Denies medication S-E.   - Smoking Status noted; current every day smoker.  - She denies new onset of: chest pain, exercise intolerance, shortness of breath, dizziness, visual changes, headache, lower extremity swelling or claudication.   Denies myalgias.  The cholesterol last visit was:  Lab Results  Component Value Date   CHOL 189 02/02/2018   HDL 50 02/02/2018   LDLCALC 122 (H) 02/02/2018   TRIG 85 02/02/2018   CHOLHDL 3.8 02/02/2018    Hepatic Function Latest Ref Rng & Units 02/02/2018 07/08/2017 03/21/2017  Total Protein 6.0 - 8.5 g/dL - 7.0 6.8  Albumin 3.6 - 4.8 g/dL - 4.6 4.4  AST 0 - 40 IU/L - 12 16  ALT 0 - 32 IU/L '13 12 21  ' Alk Phosphatase 39 - 117 IU/L - 73 71  Total Bilirubin 0.0 - 1.2 mg/dL - 0.4 0.4    Wt Readings from Last 3 Encounters:  02/05/18 166 lb (75.3 kg)  12/04/17 167 lb 3.2 oz (75.8 kg)  10/02/17 166 lb 6.4 oz (75.5 kg)   BP Readings from Last 3 Encounters:  02/05/18 130/89  12/04/17 135/86  10/02/17 138/81   Pulse Readings from Last 3 Encounters:  02/05/18 66  12/04/17 69  10/02/17 (!) 59   BMI Readings from Last 3 Encounters:  02/05/18 23.15 kg/m  12/04/17 23.32 kg/m  10/02/17 23.21 kg/m     Patient Care Team    Relationship Specialty Notifications Start End  Mellody Dance, DO PCP - General Family Medicine  02/04/17   Jari Pigg, MD Consulting Physician Dermatology  02/04/17   Marygrace Drought, MD Consulting Physician Ophthalmology  02/04/17   Nelwyn Salisbury, [place] Physician Assistant Obstetrics and Gynecology  02/10/17   Richmond Campbell, MD Consulting Physician Gastroenterology  07/08/17     Full medical history updated and reviewed in the office today  Patient Active Problem List   Diagnosis Date Noted  . Tobacco abuse 02/05/2018    Priority: High    . Hypertension 02/04/2017    Priority: High  . HLD (hyperlipidemia) 02/04/2017    Priority: High  . Glucose intolerance (impaired glucose tolerance) 03/27/2017    Priority: Medium  . Statin intolerance- only able to tolerate small dose 03/27/2017  . Chronic pain of right knee 03/27/2017  . Adjustment disorder with mixed anxiety and depressed mood 02/04/2017  . Postmenopausal- 2008 02/04/2017  . S/P colonoscopy- 2010- N 02/04/2017  . h/o Thyroid nodule- R s/p Bx 2016 or so 02/04/2017    Past Medical History:  Diagnosis Date  . Allergy  to medication  . High cholesterol   . Hypercholesterolemia   . Hypertension   . Mixed hyperlipidemia   . Postmenopausal   . Pure hypercholesterolemia   . Thyroid nodule   . Tobacco abuse     Past Surgical History:  Procedure Laterality Date  . CESAREAN SECTION      Social History   Tobacco Use  . Smoking status: Current Every Day Smoker    Packs/day: 1.00    Types: Cigarettes  . Smokeless tobacco: Never Used  Substance Use Topics  . Alcohol use: Yes    Comment: rarely    Family Hx: Family History  Problem Relation Age of Onset  . Hyperlipidemia Mother   . Hypertension Mother   . Stroke Mother   . Hypertension Father   . Hypertension Sister   . Breast cancer Maternal Aunt   . Lung cancer Paternal Uncle      Medications: Current Outpatient Medications  Medication Sig Dispense Refill  . amlodipine-benazepril (LOTREL) 2.5-10 MG capsule Take 1 capsule by mouth daily. 90 capsule 1  . Cholecalciferol (VITAMIN D3) 5000 units CAPS Take 1 capsule by mouth daily.    . Coenzyme Q10 (COQ10 PO) Take 1 capsule by mouth daily.    . Omega 3 1200 MG CAPS Take 1 capsule by mouth daily.    . rosuvastatin (CRESTOR) 20 MG tablet Take 1 tablet (20 mg total) by mouth at bedtime. 90 tablet 1   No current facility-administered medications for this visit.     Allergies:  Allergies  Allergen Reactions  . Paxil [Paroxetine]       Review of Systems: General:   No F/C, wt loss Pulm:   No DIB, SOB, pleuritic chest pain Card:  No CP, palpitations Abd:  No n/v/d or pain Ext:  No inc edema from baseline  Objective:  Blood pressure 130/89, pulse 66, temperature 98.4 F (36.9 C), height '5\' 11"'  (1.803 m), weight 166 lb (75.3 kg), SpO2 98 %. Body mass index is 23.15 kg/m. Gen:   Well NAD, A and O *3 HEENT:    [place]/AT, EOMI,  MMM Lungs:   Normal work of breathing. CTA B/L, no Wh, rhonchi Heart:   RRR, S1, S2 WNL's, no MRG Abd:   No gross distention Exts:    warm, pink,  Brisk capillary refill, warm and well perfused.  Psych:    No HI/SI, judgement and insight good, Euthymic mood. Full Affect.   Recent Results (from the past 2160 hour(s))  Lipid panel     Status: Abnormal   Collection Time: 02/02/18  8:53 AM  Result Value Ref Range   Cholesterol, Total 189 100 - 199 mg/dL   Triglycerides 85 0 - 149 mg/dL   HDL 50 >39 mg/dL   VLDL Cholesterol Cal 17 5 - 40 mg/dL   LDL Calculated 122 (H) 0 - 99 mg/dL   Chol/HDL Ratio 3.8 0.0 - 4.4 ratio    Comment:                                   T. Chol/HDL Ratio                                             Men  Women  1/2 Avg.Risk  3.4    3.3                                   Avg.Risk  5.0    4.4                                2X Avg.Risk  9.6    7.1                                3X Avg.Risk 23.4   11.0   ALT     Status: None   Collection Time: 02/02/18  8:53 AM  Result Value Ref Range   ALT 13 0 - 32 IU/L

## 2018-02-05 NOTE — Patient Instructions (Addendum)
Please increase your dose of Crestor as we discussed.  I prefer you go to 20 however, going from 5 to 10 mg is better than nothing.  We will recheck your labs in 4 to 6 months when you follow-up.      Guidelines for a Low Cholesterol, Low Saturated Fat Diet   Fats - Limit total intake of fats and oils. - Avoid butter, stick margarine, shortening, lard, palm and coconut oils. - Limit mayonnaise, salad dressings, gravies and sauces, unless they are homemade with low-fat ingredients. - Limit chocolate. - Choose low-fat and nonfat products, such as low-fat mayonnaise, low-fat or non-hydrogenated peanut butter, low-fat or fat-free salad dressings and nonfat gravy. - Use vegetable oil, such as canola or olive oil. - Look for margarine that does not contain trans fatty acids. - Use nuts in moderate amounts. - Read ingredient labels carefully to determine both amount and type of fat present in foods. Limit saturated and trans fats! - Avoid high-fat processed and convenience foods.  Meats and Meat Alternatives - Choose fish, chicken, [place] and lean meats. - Use dried beans, peas, lentils and tofu. - Limit egg yolks to three to four per week. - If you eat red meat, limit to no more than three servings per week and choose loin or round cuts. - Avoid fatty meats, such as bacon, sausage, franks, luncheon meats and ribs. - Avoid all organ meats, including liver.  Dairy - Choose nonfat or low-fat milk, yogurt and cottage cheese. - Most cheeses are high in fat. Choose cheeses made from non-fat milk, such as mozzarella and ricotta cheese. - Choose light or fat-free cream cheese and sour cream. - Avoid cream and sauces made with cream.  Fruits and Vegetables - Eat a wide variety of fruits and vegetables. - Use lemon juice, vinegar or "mist" olive oil on vegetables. - Avoid adding sauces, fat or oil to vegetables.  Breads, Cereals and Grains - Choose whole-grain breads, cereals, pastas and  rice. - Avoid high-fat snack foods, such as granola, cookies, pies, pastries, doughnuts and croissants.  Cooking Tips - Avoid deep fried foods. - Trim visible fat off meats and remove skin from poultry before cooking. - Bake, broil, boil, poach or roast poultry, fish and lean meats. - Drain and discard fat that drains out of meat as you cook it. - Add little or no fat to foods. - Use vegetable oil sprays to grease pans for cooking or baking. - Steam vegetables. - Use herbs or no-oil marinades to flavor foods.    Please think seriously about quitting smoking!  This is very important for your health and well being.   Smoking cessation instruction/counseling given:  counseled patient on the dangers of tobacco use, advised patient to stop smoking, and reviewed strategies to maximize success  Discussed with patient that there are multiple treatments to aid in quitting smoking, however I explained none will work unless pt really wants to quit  Please let us know in the future if you are interested and ready to quit.  You can also call 1-800-QUIT-NOW (306)117-0846) for free smoking cessation counseling and support.     Also, please go online to www.heart.org (the American Heart Association website) and search "quit smoking ".     Or try the centers for disease control website at: https://www.schmidt.com/  Or, go to the "national cancer institute" web site of [employer]:  http://benson.com/  There is a lot of great information on these websites for you to look over.  Want to Quit Smoking? FDA-Approved Products Can Help  Quitting smoking can be hard, but it is possible. In fact, every time you put out a cigarette is a new chance to try quitting again, according to the [place] Food and Drug Administration's newest tobacco education campaign, "Every Try Counts."   If you want to quit-almost 70 percent of  adult smokers say they do-you may want to use a "smoking cessation" product proven to help. Data has shown that using FDA-approved cessation medicine can double your chance of quitting successfully.  Some products contain nicotine as an active ingredient and others do not. These products include over-the-counter (OTC) options like skin patches, lozenges, and gum, as well as prescription medicines.  Smoking cessation products are intended to help you quit smoking. They are regulated through the [facility], which ensures that the products are safe and effective and that their benefits outweigh any known associated risks.  The Benefits of Quitting Smoking No matter how much you smoke-or for how long-quitting will benefit you.  Not only will you lower your risk of getting various cancers, including lung cancer, you'll also reduce your chances of having heart disease, a stroke, emphysema, and other serious diseases. Quitting also will lower the risk of heart disease and lung cancer in nonsmokers who otherwise would be exposed to your secondhand smoke.  Although there are benefits to quitting at any age, it is important to quit as soon as possible so your body can begin to heal from the damage caused by smoking. For instance, 12 hours after you quit smoking the carbon monoxide level in your blood drops to normal. Carbon monoxide is harmful because it displaces oxygen in the blood and deprives your heart, brain, and other vital organs of oxygen.  What To Know About Smoking Cessation Products Understanding how smoking cessation products work-and what side effects they may cause-can help you determine which product may be best for you.  If you're considering one of these products, reading labels and talking to your pharmacist and other health care providers are good first steps to take.  You also can check the FDA's website for more information on each product at  Drugs@FDA , where you can search for each product by name.  And remember to weigh each product's benefits and risks, among other considerations.  About Nicotine Replacement Therapy (NRT) Nicotine is the substance primarily responsible for causing addiction to tobacco products. Tobacco users who are addicted to nicotine are used to having nicotine in their bodies.  As you try to quit smoking, you may have symptoms of nicotine withdrawal. When you quit, this withdrawal may cause symptoms like cravings, or urges, to smoke; depression; trouble sleeping; irritability; anxiety; and increased appetite.  Nicotine withdrawal can discourage some smokers from continuing with a quit attempt. But the FDA has approved several smoking cessation products designed to help users gradually withdraw from smoking (that is, "wean" themselves from smoking) by using specific amounts of nicotine that decrease over time. This type of product is called a "nicotine replacement therapy" or NRT. It supplies nicotine in controlled amounts while sparing you from other chemicals found in tobacco products.  NRTs are available over the counter and by prescription. You should generally use them only for a short time to help you manage nicotine cravings and withdrawal. However, the FDA recognizes that some people may need to use these products longer to stay smoke-free. Talk to your health care provider to determine the best  course of treatment for you.  Over-the-counter NRTs are approved for sale to people age 19 and older. They are available under various brand names and sometimes as generic products. They include:  - Skin patches (also called "transdermal nicotine patches"). These patches are placed on the skin, similar to how you would apply an adhesive bandage. - Chewing gum (also called "nicotine gum"). This gum must be chewed according to the labeled instructions to be effective. - Lozenges (also called "nicotine lozenges"). You  use these products by dissolving them in your mouth. For over-the-counter products, it's important to follow the instructions on the Drug Facts Label (DFL) and to read the enclosed User's Guide for complete directions and other important information. Ask your health care provider if you have questions.  Currently, prescription nicotine replacement therapy is available only under the brand name Nicotrol, and is available both as a nasal spray and an oral inhaler. The products are FDA-approved only for use by adults.  If you are under age 74 and want to quit smoking, talk to a health care professional about whether you should use nicotine replacement therapies.  Important Advice for People Considering Nicotine Replacement Therapy Women who are pregnant or breastfeeding should talk to their health care providers and use nicotine replacement products only if the health care providers approve.  Also talk to your health care provider before using these products if you have:  diabetes, heart disease, asthma, or stomach ulcers; had a recent heart attack; high blood pressure that is not controlled with medicine; a history of irregular heartbeat; or been prescribed medication to help you quit smoking. If you take prescription medication for depression or asthma, tell your health care provider if you are quitting smoking because he or she may need to change your prescription dose.  Stop using a nicotine replacement product and call your health care professional if you have any of the following symptoms: nausea; dizziness; weakness; vomiting; fast or irregular heartbeat; mouth problems with the lozenge or gum; or redness or swelling of the skin around the patch that does not go away.  About Prescription Cessation Medicines Without Nicotine  The FDA has approved two smoking cessation products that do not contain nicotine. They are Chantix (varenicline tartrate) and Zyban (buproprion hydrochloride).  Both are available in tablet form and by prescription only.  Chantix acts at sites in the brain affected by nicotine by reducing the rewarding effects of nicotine. The precise way that Zyban helps with smoking cessation is unknown.  As with other prescription products, the FDA has evaluated these medicines and found that the benefits outweigh the risks. For users taking these products, risks include changes in behavior, depressed mood, hostility, aggression, and suicidal thoughts or actions.  The most common side effects of Chantix include nausea; constipation; gas; vomiting; and trouble sleeping or vivid, unusual, or strange dreams. Chantix also may change how you react to alcohol, so talk to your health care provider about your drinking habits (if you drink alcohol) and whether these habits need to change. Chantix is not recommended for people under the age of 70.  The most commonly observed side effects consistently associated with the use of Zyban are dry mouth and insomnia.  Because Zyban contains the same active ingredient as the antidepressant Wellbutrin (bupropion), the FDA encourages people who use Zyban-and those who are considering it-to talk to their health care providers about the risks of treatment with antidepressant medicines. Zyban has not been studied in children under the age  of 18 and is not approved for use in children and teenagers.  Note: If your health care provider prescribes Chantix or Zyban, please read the product's patient medication guide in its entirety. These guides offer important information on side effects, risks, warnings, product ingredients, and what you should talk about with your health care provider before taking the products.  Finally, if you ever have any side effects related to any smoking cessation products, or have any other problems related to your treatment, the FDA would like to hear from you. Please consider making a voluntary and confidential report to  the FDA's MedWatch program.  Updated: February 26, 2016

## 2018-02-10 ENCOUNTER — Encounter: Payer: Self-pay | Admitting: Physical Therapy

## 2018-03-04 ENCOUNTER — Other Ambulatory Visit: Payer: BC Managed Care – PPO

## 2018-07-13 ENCOUNTER — Encounter: Payer: BC Managed Care – PPO | Admitting: Family Medicine

## 2018-09-09 ENCOUNTER — Other Ambulatory Visit (INDEPENDENT_AMBULATORY_CARE_PROVIDER_SITE_OTHER): Payer: BC Managed Care – PPO

## 2018-09-09 ENCOUNTER — Other Ambulatory Visit: Payer: Self-pay

## 2018-09-09 ENCOUNTER — Encounter: Payer: Self-pay | Admitting: Family Medicine

## 2018-09-09 ENCOUNTER — Ambulatory Visit (INDEPENDENT_AMBULATORY_CARE_PROVIDER_SITE_OTHER): Payer: BC Managed Care – PPO | Admitting: Family Medicine

## 2018-09-09 VITALS — BP 120/80 | HR 70 | Temp 98.2°F | Ht 70.0 in | Wt 165.2 lb

## 2018-09-09 DIAGNOSIS — Z719 Counseling, unspecified: Secondary | ICD-10-CM | POA: Diagnosis not present

## 2018-09-09 DIAGNOSIS — Z0000 Encounter for general adult medical examination without abnormal findings: Secondary | ICD-10-CM

## 2018-09-09 DIAGNOSIS — R7302 Impaired glucose tolerance (oral): Secondary | ICD-10-CM

## 2018-09-09 DIAGNOSIS — Z122 Encounter for screening for malignant neoplasm of respiratory organs: Secondary | ICD-10-CM | POA: Diagnosis not present

## 2018-09-09 DIAGNOSIS — F17210 Nicotine dependence, cigarettes, uncomplicated: Secondary | ICD-10-CM

## 2018-09-09 DIAGNOSIS — I10 Essential (primary) hypertension: Secondary | ICD-10-CM

## 2018-09-09 DIAGNOSIS — Z23 Encounter for immunization: Secondary | ICD-10-CM | POA: Diagnosis not present

## 2018-09-09 DIAGNOSIS — Z789 Other specified health status: Secondary | ICD-10-CM

## 2018-09-09 DIAGNOSIS — E782 Mixed hyperlipidemia: Secondary | ICD-10-CM

## 2018-09-09 DIAGNOSIS — E785 Hyperlipidemia, unspecified: Secondary | ICD-10-CM

## 2018-09-09 NOTE — Patient Instructions (Signed)
Preventive Care for Adults, Female  A healthy lifestyle and preventive care can promote health and wellness. Preventive health guidelines for women include the following key practices.   A routine yearly physical is a good way to check with your health care provider about your health and preventive screening. It is a chance to share any concerns and updates on your health and to receive a thorough exam.   Visit your dentist for a routine exam and preventive care every 6 months. Brush your teeth twice a day and floss once a day. Good oral hygiene prevents tooth decay and gum disease.   The frequency of eye exams is based on your age, health, family medical history, use of contact lenses, and other factors. Follow your health care provider's recommendations for frequency of eye exams.   Eat a healthy diet. Foods like vegetables, fruits, whole grains, low-fat dairy products, and lean protein foods contain the nutrients you need without too many calories. Decrease your intake of foods high in solid fats, added sugars, and salt. Eat the right amount of calories for you.Get information about a proper diet from your health care provider, if necessary.   Regular physical exercise is one of the most important things you can do for your health. Most adults should get at least 150 minutes of moderate-intensity exercise (any activity that increases your heart rate and causes you to sweat) each week. In addition, most adults need muscle-strengthening exercises on 2 or more days a week.   Maintain a healthy weight. The body mass index (BMI) is a screening tool to identify possible weight problems. It provides an estimate of body fat based on height and weight. Your health care provider can find your BMI, and can help you achieve or maintain a healthy weight.For adults 20 years and older:   - A BMI below 18.5 is considered underweight.   - A BMI of 18.5 to 24.9 is normal.   - A BMI of 25 to 29.9 is  considered overweight.   - A BMI of 30 and above is considered obese.   Maintain normal blood lipids and cholesterol levels by exercising and minimizing your intake of trans and saturated fats.  Eat a balanced diet with plenty of fruit and vegetables. Blood tests for lipids and cholesterol should begin at age 20 and be repeated every 5 years minimum.  If your lipid or cholesterol levels are high, you are over 40, or you are at high risk for heart disease, you may need your cholesterol levels checked more frequently.Ongoing high lipid and cholesterol levels should be treated with medicines if diet and exercise are not working.   If you smoke, find out from your health care provider how to quit. If you do not use tobacco, do not start.   Lung cancer screening is recommended for adults aged 55-80 years who are at high risk for developing lung cancer because of a history of smoking. A yearly low-dose CT scan of the lungs is recommended for people who have at least a 30-pack-year history of smoking and are a current smoker or have quit within the past 15 years. A pack year of smoking is smoking an average of 1 pack of cigarettes a day for 1 year (for example: 1 pack a day for 30 years or 2 packs a day for 15 years). Yearly screening should continue until the smoker has stopped smoking for at least 15 years. Yearly screening should be stopped for people who develop a   health problem that would prevent them from having lung cancer treatment.   If you are pregnant, do not drink alcohol. If you are breastfeeding, be very cautious about drinking alcohol. If you are not pregnant and choose to drink alcohol, do not have more than 1 drink per day. One drink is considered to be 12 ounces (355 mL) of beer, 5 ounces (148 mL) of wine, or 1.5 ounces (44 mL) of liquor.   Avoid use of street drugs. Do not share needles with anyone. Ask for help if you need support or instructions about stopping the use of  drugs.   High blood pressure causes heart disease and increases the risk of stroke. Your blood pressure should be checked at least yearly.  Ongoing high blood pressure should be treated with medicines if weight loss and exercise do not work.   If you are 69-55 years old, ask your health care provider if you should take aspirin to prevent strokes.   Diabetes screening involves taking a blood sample to check your fasting blood sugar level. This should be done once every 3 years, after age 38, if you are within normal weight and without risk factors for diabetes. Testing should be considered at a younger age or be carried out more frequently if you are overweight and have at least 1 risk factor for diabetes.   Breast cancer screening is essential preventive care for women. You should practice "breast self-awareness."  This means understanding the normal appearance and feel of your breasts and may include breast self-examination.  Any changes detected, no matter how small, should be reported to a health care provider.  Women in their 80s and 30s should have a clinical breast exam (CBE) by a health care provider as part of a regular health exam every 1 to 3 years.  After age 66, women should have a CBE every year.  Starting at age 1, women should consider having a mammogram (breast X-ray test) every year.  Women who have a family history of breast cancer should talk to their health care provider about genetic screening.  Women at a high risk of breast cancer should talk to their health care providers about having an MRI and a mammogram every year.   -Breast cancer gene (BRCA)-related cancer risk assessment is recommended for women who have family members with BRCA-related cancers. BRCA-related cancers include breast, ovarian, tubal, and peritoneal cancers. Having family members with these cancers may be associated with an increased risk for harmful changes (mutations) in the breast cancer genes BRCA1 and  BRCA2. Results of the assessment will determine the need for genetic counseling and BRCA1 and BRCA2 testing.   The Pap test is a screening test for cervical cancer. A Pap test can show cell changes on the cervix that might become cervical cancer if left untreated. A Pap test is a procedure in which cells are obtained and examined from the lower end of the uterus (cervix).   - Women should have a Pap test starting at age 57.   - Between ages 90 and 70, Pap tests should be repeated every 2 years.   - Beginning at age 63, you should have a Pap test every 3 years as long as the past 3 Pap tests have been normal.   - Some women have medical problems that increase the chance of getting cervical cancer. Talk to your health care provider about these problems. It is especially important to talk to your health care provider if a  new problem develops soon after your last Pap test. In these cases, your health care provider may recommend more frequent screening and Pap tests.   - The above recommendations are the same for women who have or have not gotten the vaccine for human papillomavirus (HPV).   - If you had a hysterectomy for a problem that was not cancer or a condition that could lead to cancer, then you no longer need Pap tests. Even if you no longer need a Pap test, a regular exam is a good idea to make sure no other problems are starting.   - If you are between ages 36 and 66 years, and you have had normal Pap tests going back 10 years, you no longer need Pap tests. Even if you no longer need a Pap test, a regular exam is a good idea to make sure no other problems are starting.   - If you have had past treatment for cervical cancer or a condition that could lead to cancer, you need Pap tests and screening for cancer for at least 20 years after your treatment.   - If Pap tests have been discontinued, risk factors (such as a new sexual partner) need to be reassessed to determine if screening should  be resumed.   - The HPV test is an additional test that may be used for cervical cancer screening. The HPV test looks for the virus that can cause the cell changes on the cervix. The cells collected during the Pap test can be tested for HPV. The HPV test could be used to screen women aged 70 years and older, and should be used in women of any age who have unclear Pap test results. After the age of 67, women should have HPV testing at the same frequency as a Pap test.   Colorectal cancer can be detected and often prevented. Most routine colorectal cancer screening begins at the age of 57 years and continues through age 26 years. However, your health care provider may recommend screening at an earlier age if you have risk factors for colon cancer. On a yearly basis, your health care provider may provide home test kits to check for hidden blood in the stool.  Use of a small camera at the end of a tube, to directly examine the colon (sigmoidoscopy or colonoscopy), can detect the earliest forms of colorectal cancer. Talk to your health care provider about this at age 23, when routine screening begins. Direct exam of the colon should be repeated every 5 -10 years through age 49 years, unless early forms of pre-cancerous polyps or small growths are found.   People who are at an increased risk for hepatitis B should be screened for this virus. You are considered at high risk for hepatitis B if:  -You were born in a country where hepatitis B occurs often. Talk with your health care provider about which countries are considered high risk.  - Your parents were born in a high-risk country and you have not received a shot to protect against hepatitis B (hepatitis B vaccine).  - You have HIV or AIDS.  - You use needles to inject street drugs.  - You live with, or have sex with, someone who has Hepatitis B.  - You get hemodialysis treatment.  - You take certain medicines for conditions like cancer, organ  transplantation, and autoimmune conditions.   Hepatitis C blood testing is recommended for all people born from [birth year] through 1965 and any individual  with known risks for hepatitis C.   Practice safe sex. Use condoms and avoid high-risk sexual practices to reduce the spread of sexually transmitted infections (STIs). STIs include gonorrhea, chlamydia, syphilis, trichomonas, herpes, HPV, and human immunodeficiency virus (HIV). Herpes, HIV, and HPV are viral illnesses that have no cure. They can result in disability, cancer, and death. Sexually active women aged 25 years and younger should be checked for chlamydia. Older women with new or multiple partners should also be tested for chlamydia. Testing for other STIs is recommended if you are sexually active and at increased risk.   Osteoporosis is a disease in which the bones lose minerals and strength with aging. This can result in serious bone fractures or breaks. The risk of osteoporosis can be identified using a bone density scan. Women ages 65 years and over and women at risk for fractures or osteoporosis should discuss screening with their health care providers. Ask your health care provider whether you should take a calcium supplement or vitamin D to There are also several preventive steps women can take to avoid osteoporosis and resulting fractures or to keep osteoporosis from worsening. -->Recommendations include:  Eat a balanced diet high in fruits, vegetables, calcium, and vitamins.  Get enough calcium. The recommended total intake of is 1,200 mg daily; for best absorption, if taking supplements, divide doses into 250-500 mg doses throughout the day. Of the two types of calcium, calcium carbonate is best absorbed when taken with food but calcium citrate can be taken on an empty stomach.  Get enough vitamin D. NAMS and the National Osteoporosis Foundation recommend at least 1,000 IU per day for women age 50 and over who are at risk of vitamin D  deficiency. Vitamin D deficiency can be caused by inadequate sun exposure (for example, those who live in northern latitudes).  Avoid alcohol and smoking. Heavy alcohol intake (more than 7 drinks per week) increases the risk of falls and hip fracture and women smokers tend to lose bone more rapidly and have lower bone mass than nonsmokers. Stopping smoking is one of the most important changes women can make to improve their health and decrease risk for disease.  Be physically active every day. Weight-bearing exercise (for example, fast walking, hiking, jogging, and weight training) may strengthen bones or slow the rate of bone loss that comes with aging. Balancing and muscle-strengthening exercises can reduce the risk of falling and fracture.  Consider therapeutic medications. Currently, several types of effective drugs are available. Healthcare providers can recommend the type most appropriate for each woman.  Eliminate environmental factors that may contribute to accidents. Falls cause nearly 90% of all osteoporotic fractures, so reducing this risk is an important bone-health strategy. Measures include ample lighting, removing obstructions to walking, using nonskid rugs on floors, and placing mats and/or grab bars in showers.  Be aware of medication side effects. Some common medicines make bones weaker. These include a type of steroid drug called glucocorticoids used for arthritis and asthma, some antiseizure drugs, certain sleeping pills, treatments for endometriosis, and some cancer drugs. An overactive thyroid gland or using too much thyroid hormone for an underactive thyroid can also be a problem. If you are taking these medicines, talk to your doctor about what you can do to help protect your bones.reduce the rate of osteoporosis.    Menopause can be associated with physical symptoms and risks. Hormone replacement therapy is available to decrease symptoms and risks. You should talk to your  health care provider   about whether hormone replacement therapy is right for you.   Use sunscreen. Apply sunscreen liberally and repeatedly throughout the day. You should seek shade when your shadow is shorter than you. Protect yourself by wearing long sleeves, pants, a wide-brimmed hat, and sunglasses year round, whenever you are outdoors.   Once a month, do a whole body skin exam, using a mirror to look at the skin on your back. Tell your health care provider of new moles, moles that have irregular borders, moles that are larger than a pencil eraser, or moles that have changed in shape or color.   -Stay current with required vaccines (immunizations).   Influenza vaccine. All adults should be immunized every year.  Tetanus, diphtheria, and acellular pertussis (Td, Tdap) vaccine. Pregnant women should receive 1 dose of Tdap vaccine during each pregnancy. The dose should be obtained regardless of the length of time since the last dose. Immunization is preferred during the 27th 36th week of gestation. An adult who has not previously received Tdap or who does not know her vaccine status should receive 1 dose of Tdap. This initial dose should be followed by tetanus and diphtheria toxoids (Td) booster doses every 10 years. Adults with an unknown or incomplete history of completing a 3-dose immunization series with Td-containing vaccines should begin or complete a primary immunization series including a Tdap dose. Adults should receive a Td booster every 10 years.  Varicella vaccine. An adult without evidence of immunity to varicella should receive 2 doses or a second dose if she has previously received 1 dose. Pregnant females who do not have evidence of immunity should receive the first dose after pregnancy. This first dose should be obtained before leaving the health care facility. The second dose should be obtained 4 8 weeks after the first dose.  Human papillomavirus (HPV) vaccine. Females aged 13 26  years who have not received the vaccine previously should obtain the 3-dose series. The vaccine is not recommended for use in pregnant females. However, pregnancy testing is not needed before receiving a dose. If a female is found to be pregnant after receiving a dose, no treatment is needed. In that case, the remaining doses should be delayed until after the pregnancy. Immunization is recommended for any person with an immunocompromised condition through the age of 26 years if she did not get any or all doses earlier. During the 3-dose series, the second dose should be obtained 4 8 weeks after the first dose. The third dose should be obtained 24 weeks after the first dose and 16 weeks after the second dose.  Zoster vaccine. One dose is recommended for adults aged 60 years or older unless certain conditions are present.  Measles, mumps, and rubella (MMR) vaccine. Adults born before 1957 generally are considered immune to measles and mumps. Adults born in 1957 or later should have 1 or more doses of MMR vaccine unless there is a contraindication to the vaccine or there is laboratory evidence of immunity to each of the three diseases. A routine second dose of MMR vaccine should be obtained at least 28 days after the first dose for students attending postsecondary schools, health care workers, or international travelers. People who received inactivated measles vaccine or an unknown type of measles vaccine during 1963 1967 should receive 2 doses of MMR vaccine. People who received inactivated mumps vaccine or an unknown type of mumps vaccine before 1979 and are at high risk for mumps infection should consider immunization with 2 doses of   MMR vaccine. For females of childbearing age, rubella immunity should be determined. If there is no evidence of immunity, females who are not pregnant should be vaccinated. If there is no evidence of immunity, females who are pregnant should delay immunization until after pregnancy.  Unvaccinated health care workers born before [birth year] who lack laboratory evidence of measles, mumps, or rubella immunity or laboratory confirmation of disease should consider measles and mumps immunization with 2 doses of MMR vaccine or rubella immunization with 1 dose of MMR vaccine.  Pneumococcal 13-valent conjugate (PCV13) vaccine. When indicated, a person who is uncertain of her immunization history and has no record of immunization should receive the PCV13 vaccine. An adult aged 54 years or older who has certain medical conditions and has not been previously immunized should receive 1 dose of PCV13 vaccine. This PCV13 should be followed with a dose of pneumococcal polysaccharide (PPSV23) vaccine. The PPSV23 vaccine dose should be obtained at least 8 weeks after the dose of PCV13 vaccine. An adult aged 58 years or older who has certain medical conditions and previously received 1 or more doses of PPSV23 vaccine should receive 1 dose of PCV13. The PCV13 vaccine dose should be obtained 1 or more years after the last PPSV23 vaccine dose.  Pneumococcal polysaccharide (PPSV23) vaccine. When PCV13 is also indicated, PCV13 should be obtained first. All adults aged 58 years and older should be immunized. An adult younger than age 65 years who has certain medical conditions should be immunized. Any person who resides in a nursing home or long-term care facility should be immunized. An adult smoker should be immunized. People with an immunocompromised condition and certain other conditions should receive both PCV13 and PPSV23 vaccines. People with human immunodeficiency virus (HIV) infection should be immunized as soon as possible after diagnosis. Immunization during chemotherapy or radiation therapy should be avoided. Routine use of PPSV23 vaccine is not recommended for American Indians, [place] Natives, or people younger than 65 years unless there are medical conditions that require PPSV23 vaccine. When indicated,  people who have unknown immunization and have no record of immunization should receive PPSV23 vaccine. One-time revaccination 5 years after the first dose of PPSV23 is recommended for people aged 70 64 years who have chronic kidney failure, nephrotic syndrome, asplenia, or immunocompromised conditions. People who received 1 2 doses of PPSV23 before age 32 years should receive another dose of PPSV23 vaccine at age 96 years or later if at least 5 years have passed since the previous dose. Doses of PPSV23 are not needed for people immunized with PPSV23 at or after age 55 years.  Meningococcal vaccine. Adults with asplenia or persistent complement component deficiencies should receive 2 doses of quadrivalent meningococcal conjugate (MenACWY-D) vaccine. The doses should be obtained at least 2 months apart. Microbiologists working with certain meningococcal bacteria, [employer] recruits, people at risk during an outbreak, and people who travel to or live in countries with a high rate of meningitis should be immunized. A first-year college student up through age 58 years who is living in a residence hall should receive a dose if she did not receive a dose on or after her 16th birthday. Adults who have certain high-risk conditions should receive one or more doses of vaccine.  Hepatitis A vaccine. Adults who wish to be protected from this disease, have certain high-risk conditions, work with hepatitis A-infected animals, work in hepatitis A research labs, or travel to or work in countries with a high rate of hepatitis A should be  immunized. Adults who were previously unvaccinated and who anticipate close contact with an international adoptee during the first 60 days after arrival in the [place] from a country with a high rate of hepatitis A should be immunized.  Hepatitis B vaccine.  Adults who wish to be protected from this disease, have certain high-risk conditions, may be exposed to blood or other infectious  body fluids, are household contacts or sex partners of hepatitis B positive people, are clients or workers in certain care facilities, or travel to or work in countries with a high rate of hepatitis B should be immunized.  Haemophilus influenzae type b (Hib) vaccine. A previously unvaccinated person with asplenia or sickle cell disease or having a scheduled splenectomy should receive 1 dose of Hib vaccine. Regardless of previous immunization, a recipient of a hematopoietic stem cell transplant should receive a 3-dose series 6 12 months after her successful transplant. Hib vaccine is not recommended for adults with HIV infection.  Preventive Services / Frequency Ages 6 to 39years  Blood pressure check.** / Every 1 to 2 years.  Lipid and cholesterol check.** / Every 5 years beginning at age 39.  Clinical breast exam.** / Every 3 years for women in their 61s and 62s.  BRCA-related cancer risk assessment.** / For women who have family members with a BRCA-related cancer (breast, ovarian, tubal, or peritoneal cancers).  Pap test.** / Every 2 years from ages 47 through 85. Every 3 years starting at age 34 through age 12 or 74 with a history of 3 consecutive normal Pap tests.  HPV screening.** / Every 3 years from ages 46 through ages 43 to 54 with a history of 3 consecutive normal Pap tests.  Hepatitis C blood test.** / For any individual with known risks for hepatitis C.  Skin self-exam. / Monthly.  Influenza vaccine. / Every year.  Tetanus, diphtheria, and acellular pertussis (Tdap, Td) vaccine.** / Consult your health care provider. Pregnant women should receive 1 dose of Tdap vaccine during each pregnancy. 1 dose of Td every 10 years.  Varicella vaccine.** / Consult your health care provider. Pregnant females who do not have evidence of immunity should receive the first dose after pregnancy.  HPV vaccine. / 3 doses over 6 months, if 64 and younger. The vaccine is not recommended for use in  pregnant females. However, pregnancy testing is not needed before receiving a dose.  Measles, mumps, rubella (MMR) vaccine.** / You need at least 1 dose of MMR if you were born in 1957 or later. You may also need a 2nd dose. For females of childbearing age, rubella immunity should be determined. If there is no evidence of immunity, females who are not pregnant should be vaccinated. If there is no evidence of immunity, females who are pregnant should delay immunization until after pregnancy.  Pneumococcal 13-valent conjugate (PCV13) vaccine.** / Consult your health care provider.  Pneumococcal polysaccharide (PPSV23) vaccine.** / 1 to 2 doses if you smoke cigarettes or if you have certain conditions.  Meningococcal vaccine.** / 1 dose if you are age 71 to 37 years and a Market researcher living in a residence hall, or have one of several medical conditions, you need to get vaccinated against meningococcal disease. You may also need additional booster doses.  Hepatitis A vaccine.** / Consult your health care provider.  Hepatitis B vaccine.** / Consult your health care provider.  Haemophilus influenzae type b (Hib) vaccine.** / Consult your health care provider.  Ages 55 to 64years  Blood pressure check.** / Every 1 to 2 years.  Lipid and cholesterol check.** / Every 5 years beginning at age 20 years.  Lung cancer screening. / Every year if you are aged 55 80 years and have a 30-pack-year history of smoking and currently smoke or have quit within the past 15 years. Yearly screening is stopped once you have quit smoking for at least 15 years or develop a health problem that would prevent you from having lung cancer treatment.  Clinical breast exam.** / Every year after age 40 years.  BRCA-related cancer risk assessment.** / For women who have family members with a BRCA-related cancer (breast, ovarian, tubal, or peritoneal cancers).  Mammogram.** / Every year beginning at age 40  years and continuing for as long as you are in good health. Consult with your health care provider.  Pap test.** / Every 3 years starting at age 30 years through age 65 or 70 years with a history of 3 consecutive normal Pap tests.  HPV screening.** / Every 3 years from ages 30 years through ages 65 to 70 years with a history of 3 consecutive normal Pap tests.  Fecal occult blood test (FOBT) of stool. / Every year beginning at age 50 years and continuing until age 75 years. You may not need to do this test if you get a colonoscopy every 10 years.  Flexible sigmoidoscopy or colonoscopy.** / Every 5 years for a flexible sigmoidoscopy or every 10 years for a colonoscopy beginning at age 50 years and continuing until age 75 years.  Hepatitis C blood test.** / For all people born from 1945 through 1965 and any individual with known risks for hepatitis C.  Skin self-exam. / Monthly.  Influenza vaccine. / Every year.  Tetanus, diphtheria, and acellular pertussis (Tdap/Td) vaccine.** / Consult your health care provider. Pregnant women should receive 1 dose of Tdap vaccine during each pregnancy. 1 dose of Td every 10 years.  Varicella vaccine.** / Consult your health care provider. Pregnant females who do not have evidence of immunity should receive the first dose after pregnancy.  Zoster vaccine.** / 1 dose for adults aged 60 years or older.  Measles, mumps, rubella (MMR) vaccine.** / You need at least 1 dose of MMR if you were born in 1957 or later. You may also need a 2nd dose. For females of childbearing age, rubella immunity should be determined. If there is no evidence of immunity, females who are not pregnant should be vaccinated. If there is no evidence of immunity, females who are pregnant should delay immunization until after pregnancy.  Pneumococcal 13-valent conjugate (PCV13) vaccine.** / Consult your health care provider.  Pneumococcal polysaccharide (PPSV23) vaccine.** / 1 to 2 doses if  you smoke cigarettes or if you have certain conditions.  Meningococcal vaccine.** / Consult your health care provider.  Hepatitis A vaccine.** / Consult your health care provider.  Hepatitis B vaccine.** / Consult your health care provider.  Haemophilus influenzae type b (Hib) vaccine.** / Consult your health care provider.  Ages 65 years and over  Blood pressure check.** / Every 1 to 2 years.  Lipid and cholesterol check.** / Every 5 years beginning at age 20 years.  Lung cancer screening. / Every year if you are aged 55 80 years and have a 30-pack-year history of smoking and currently smoke or have quit within the past 15 years. Yearly screening is stopped once you have quit smoking for at least 15 years or develop a health problem that   would prevent you from having lung cancer treatment.  Clinical breast exam.** / Every year after age 103 years.  BRCA-related cancer risk assessment.** / For women who have family members with a BRCA-related cancer (breast, ovarian, tubal, or peritoneal cancers).  Mammogram.** / Every year beginning at age 36 years and continuing for as long as you are in good health. Consult with your health care provider.  Pap test.** / Every 3 years starting at age 5 years through age 85 or 10 years with 3 consecutive normal Pap tests. Testing can be stopped between 65 and 70 years with 3 consecutive normal Pap tests and no abnormal Pap or HPV tests in the past 10 years.  HPV screening.** / Every 3 years from ages 93 years through ages 70 or 45 years with a history of 3 consecutive normal Pap tests. Testing can be stopped between 65 and 70 years with 3 consecutive normal Pap tests and no abnormal Pap or HPV tests in the past 10 years.  Fecal occult blood test (FOBT) of stool. / Every year beginning at age 8 years and continuing until age 45 years. You may not need to do this test if you get a colonoscopy every 10 years.  Flexible sigmoidoscopy or colonoscopy.** /  Every 5 years for a flexible sigmoidoscopy or every 10 years for a colonoscopy beginning at age 69 years and continuing until age 68 years.  Hepatitis C blood test.** / For all people born from [birth year] through 1966 and any individual with known risks for hepatitis C.  Osteoporosis screening.** / A one-time screening for women ages 7 years and over and women at risk for fractures or osteoporosis.  Skin self-exam. / Monthly.  Influenza vaccine. / Every year.  Tetanus, diphtheria, and acellular pertussis (Tdap/Td) vaccine.** / 1 dose of Td every 10 years.  Varicella vaccine.** / Consult your health care provider.  Zoster vaccine.** / 1 dose for adults aged 5 years or older.  Pneumococcal 13-valent conjugate (PCV13) vaccine.** / Consult your health care provider.  Pneumococcal polysaccharide (PPSV23) vaccine.** / 1 dose for all adults aged 74 years and older.  Meningococcal vaccine.** / Consult your health care provider.  Hepatitis A vaccine.** / Consult your health care provider.  Hepatitis B vaccine.** / Consult your health care provider.  Haemophilus influenzae type b (Hib) vaccine.** / Consult your health care provider. ** Family history and personal history of risk and conditions may change your health care provider's recommendations. Document Released: 04/30/2001 Document Revised: 12/23/2012  [facility] Patient Information 2014 [place], [place].   EXERCISE AND DIET:  We recommended that you start or continue a regular exercise program for good health. Regular exercise means any activity that makes your heart beat faster and makes you sweat.  We recommend exercising at least 30 minutes per day at least 3 days a week, preferably 5.  We also recommend a diet low in fat and sugar / carbohydrates.  Inactivity, poor dietary choices and obesity can cause diabetes, heart attack, stroke, and kidney damage, among others.     ALCOHOL AND SMOKING:  Women should limit their alcohol intake to no  more than 7 drinks/beers/glasses of wine (combined, not each!) per week. Moderation of alcohol intake to this level decreases your risk of breast cancer and liver damage.  ( And of course, no recreational drugs are part of a healthy lifestyle.)  Also, you should not be smoking at all or even being exposed to second hand smoke. Most people know smoking can  cause cancer, and various heart and lung diseases, but did you know it also contributes to weakening of your bones?  Aging of your skin?  Yellowing of your teeth and nails?   CALCIUM AND VITAMIN D:  Adequate intake of calcium and Vitamin D are recommended.  The recommendations for exact amounts of these supplements seem to change often, but generally speaking 600 mg of calcium (either carbonate or citrate) and 800 units of Vitamin D per day seems prudent. Certain women may benefit from higher intake of Vitamin D.  If you are among these women, your doctor will have told you during your visit.     PAP SMEARS:  Pap smears, to check for cervical cancer or precancers,  have traditionally been done yearly, although recent scientific advances have shown that most women can have pap smears less often.  However, every woman still should have a physical exam from her gynecologist or primary care physician every year. It will include a breast check, inspection of the vulva and vagina to check for abnormal growths or skin changes, a visual exam of the cervix, and then an exam to evaluate the size and shape of the uterus and ovaries.  And after 64 years of age, a rectal exam is indicated to check for rectal cancers. We will also provide age appropriate advice regarding health maintenance, like when you should have certain vaccines, screening for sexually transmitted diseases, bone density testing, colonoscopy, mammograms, etc.    MAMMOGRAMS:  All women over 71 years old should have a yearly mammogram. Many facilities now offer a "3D" mammogram, which may cost  around $50 extra out of pocket. If possible,  we recommend you accept the option to have the 3D mammogram performed.  It both reduces the number of women who will be called back for extra views which then turn out to be normal, and it is better than the routine mammogram at detecting truly abnormal areas.     COLONOSCOPY:  Colonoscopy to screen for colon cancer is recommended for all women at age 52.  We know, you hate the idea of the prep.  We agree, BUT, having colon cancer and not knowing it is worse!!  Colon cancer so often starts as a polyp that can be seen and removed at colonscopy, which can quite literally save your life!  And if your first colonoscopy is normal and you have no family history of colon cancer, most women don't have to have it again for 10 years.  Once every ten years, you can do something that may end up saving your life, right?  We will be happy to help you get it scheduled when you are ready.  Be sure to check your insurance coverage so you understand how much it will cost.  It may be covered as a preventative service at no cost, but you should check your particular policy.

## 2018-09-09 NOTE — Progress Notes (Signed)
Impression and Recommendations:    1. Encounter for wellness examination   2. Health education/counseling   3. Encounter for screening for malignant neoplasm of respiratory organs   4. Smokes with greater than 30 pack year history   5. Need for Tdap vaccination     1) Anticipatory Guidance: Discussed importance of wearing a seatbelt while driving, not texting while driving; sunscreen when outside along with yearly skin surveillance; eating a well balanced and modest diet; physical activity at least 25 minutes per day or 150 min/ week of moderate to intense activity.  2) Immunizations / Screenings / Labs:  All immunizations and screenings that patient agrees to, are up-to-date per recommendations or will be updated today.  Patient understands the needs for q 3mo dental and yearly vision screens which pt will schedule independently. Obtain CBC, CMP, HgA1c, Lipid panel, TSH and vit D when fasting if not already done recently.   3) Weight:   Discussed goal to improve nutrient density of diet through increasing intake of fruits and vegetables and decreasing saturated/trans fats, white flour products and refined sugar products.   Maintain weight.   Smoking cess counseling done 5-85min   Orders Placed This Encounter  Procedures  . CT CHEST LUNG CANCER SCREENING LOW DOSE WO CONTRAST  . Tdap vaccine greater than or equal to 7yo IM    Gross side effects, risk and benefits, and alternatives of medications discussed with patient.  Patient is aware that all medications have potential side effects and we are unable to predict every side effect or drug-drug interaction that may occur.  Expresses verbal understanding and consents to current therapy plan and treatment regimen.  F-up preventative CPE in 1 year. F/up sooner for chronic care management as discussed and/or prn.  Please see orders placed and AVS handed out to patient at the end of our visit for further patient instructions/  counseling done pertaining to today's office visit.   Mellody Dance, DO 5:59 PM     Subjective:    Chief Complaint  Patient presents with  . Annual Exam   - have not seen pt in about 9 months. Last seen Nov '19.   She was not compliant with chol meds/ f/up appts then and still is not now.   HPI: Melessa Cowell is a 63 y.o. female who presents to [place] [facility] today a yearly health maintenance exam.  Health Maintenance Summary Reviewed and updated, unless pt declines services.  Colonoscopy:    Due for repeat this Sept- Dr Earlean Shawl. - normal 11/2008  Tobacco History Reviewed:   Y, still smoking, no imterest to quit  CT scan for screening lung CA:   The USPSTF recommends annual screening for lung cancer with low-dose computed tomography (LDCT) in adults aged 37 to 80 years who have a 30 pack-year smoking history and currently smoke or have quit within the past 15 years.   Quit in 2008, started back 2011- still smokes now w 30+ pk yr hx.  --> smoking over 1 ppd currently.  Abdominal Ultrasound:     ( Unnecessary- not female)  Alcohol:    No concerns, denies excessive use  Exercise Habits:   Not meeting AHA guidelines  STD concerns:   None  Drug Use:   None  Birth control method:   N/a- post-menopause  Menses regular:     N/a;  Last pap 6/16- normal, pt prefers to repeat next yr for the last time.  Lumps or breast concerns:      None;  mammo every June - due now- has appt  Breast Cancer Family History:     Maternal aunt age 62  Bone/ DEXA scan:    Had one- not sure when;  2013- N   Immunization History  Administered Date(s) Administered  . Tdap 02/18/2008, 09/09/2018    Health Maintenance  Topic Date Due  . PAP SMEAR-Modifier  08/18/2017  . INFLUENZA VACCINE  10/17/2018  . Hepatitis C Screening  02/06/2019 (Originally Jun 14, 1955)  . HIV Screening  02/06/2019 (Originally 10/22/1970)  . COLONOSCOPY  12/09/2018  . MAMMOGRAM  09/06/2019  .  TETANUS/TDAP  09/08/2028     Wt Readings from Last 3 Encounters:  09/09/18 165 lb 3.2 oz (74.9 kg)  02/05/18 166 lb (75.3 kg)  12/04/17 167 lb 3.2 oz (75.8 kg)   BP Readings from Last 3 Encounters:  09/09/18 120/80  02/05/18 130/89  12/04/17 135/86   Pulse Readings from Last 3 Encounters:  09/09/18 70  02/05/18 66  12/04/17 69     Past Medical History:  Diagnosis Date  . Allergy    to medication  . High cholesterol   . Hypercholesterolemia   . Hypertension   . Mixed hyperlipidemia   . Postmenopausal   . Pure hypercholesterolemia   . Thyroid nodule   . Tobacco abuse       Past Surgical History:  Procedure Laterality Date  . CESAREAN SECTION        Family History  Problem Relation Age of Onset  . Hyperlipidemia Mother   . Hypertension Mother   . Stroke Mother   . Hypertension Father   . Hypertension Sister   . Breast cancer Maternal Aunt   . Lung cancer Paternal Uncle       Social History   Substance and Sexual Activity  Drug Use No  ,   Social History   Substance and Sexual Activity  Alcohol Use Yes   Comment: rarely  ,   Social History   Tobacco Use  Smoking Status Current Every Day Smoker  . Packs/day: 1.00  . Types: Cigarettes  Smokeless Tobacco Never Used  ,   Social History   Substance and Sexual Activity  Sexual Activity Yes  . Partners: Male  . Birth control/protection: None    Current Outpatient Medications on File Prior to Visit  Medication Sig Dispense Refill  . amlodipine-benazepril (LOTREL) 2.5-10 MG capsule Take 1 capsule by mouth daily. 90 capsule 1  . Cholecalciferol (VITAMIN D3) 5000 units CAPS Take 1 capsule by mouth daily.    . Coenzyme Q10 (COQ10 PO) Take 1 capsule by mouth daily.    . Omega 3 1200 MG CAPS Take 1 capsule by mouth daily.    . rosuvastatin (CRESTOR) 20 MG tablet Take 1 tablet (20 mg total) by mouth at bedtime. (Patient taking differently: Take 10 mg by mouth at bedtime. ) 90 tablet 1   No  current facility-administered medications on file prior to visit.     Allergies: Paxil [paroxetine]  Review of Systems: General:   Denies fever, chills, unexplained weight loss.  Optho/Auditory:   Denies visual changes, blurred vision/LOV Respiratory:   Denies SOB, DOE more than baseline levels.  Cardiovascular:   Denies chest pain, palpitations, new onset peripheral edema  Gastrointestinal:   Denies nausea, vomiting, diarrhea.  Genitourinary: Denies dysuria, freq/ urgency, flank pain or discharge from genitals.  Endocrine:     Denies hot or cold intolerance, polyuria,  polydipsia. Musculoskeletal:   Denies unexplained myalgias, joint swelling, unexplained arthralgias, gait problems.  Skin:  Denies rash, suspicious lesions Neurological:     Denies dizziness, unexplained weakness, numbness  Psychiatric/Behavioral:   Denies mood changes, suicidal or homicidal ideations, hallucinations    Objective:    Blood pressure 120/80, pulse 70, temperature 98.2 F (36.8 C), height 5\' 10"  (1.778 m), weight 165 lb 3.2 oz (74.9 kg), SpO2 97 %. Body mass index is 23.7 kg/m. General Appearance:    Alert, cooperative, no distress, appears stated age  Head:    Normocephalic, without obvious abnormality, atraumatic  Eyes:    PERRL, conjunctiva/corneas clear, EOM's intact, fundi    benign, both eyes  Ears:    Normal TM's and external ear canals, both ears  Nose:   Nares normal, septum midline, mucosa normal, no drainage    or sinus tenderness  Throat:   Lips w/o lesion, mucosa moist, and tongue normal; teeth and   gums normal  Neck:   Supple, symmetrical, trachea midline, no adenopathy;    thyroid:  no enlargement/tenderness/nodules; no carotid   bruit or JVD  Back:     Symmetric, no curvature, ROM normal, no CVA tenderness  Lungs:     Clear to auscultation bilaterally, respirations unlabored, no       Wh/ R/ R  Chest Wall:    No tenderness or gross deformity; normal excursion   Heart:    Regular  rate and rhythm, S1 and S2 normal, no murmur, rub   or gallop  Breast Exam:    Chaperone present; No tenderness, masses, or nipple abnormality b/l; no d/c  Abdomen:     Soft, non-tender, bowel sounds active all four quadrants, NO   G/R/R, no masses, no organomegaly  Genitalia:    Deferred by pt   Rectal:    Deferred- going for scope in Sept  Extremities:   Extremities normal, atraumatic, no cyanosis or gross edema  Pulses:   2+ and symmetric all extremities  Skin:   Warm, dry, Skin color, texture, turgor normal, no obvious rashes or lesions Psych: No HI/SI, judgement and insight good, Euthymic mood. Full Affect.  Neurologic:   CNII-XII intact, normal strength, sensation and reflexes    Throughout

## 2018-09-10 LAB — CBC WITH DIFFERENTIAL/PLATELET
Basophils Absolute: 0 10*3/uL (ref 0.0–0.2)
Basos: 1 %
EOS (ABSOLUTE): 0.1 10*3/uL (ref 0.0–0.4)
Eos: 2 %
Hematocrit: 45.3 % (ref 34.0–46.6)
Hemoglobin: 15.4 g/dL (ref 11.1–15.9)
Immature Grans (Abs): 0 10*3/uL (ref 0.0–0.1)
Immature Granulocytes: 0 %
Lymphocytes Absolute: 1.4 10*3/uL (ref 0.7–3.1)
Lymphs: 33 %
MCH: 30.6 pg (ref 26.6–33.0)
MCHC: 34 g/dL (ref 31.5–35.7)
MCV: 90 fL (ref 79–97)
Monocytes Absolute: 0.3 10*3/uL (ref 0.1–0.9)
Monocytes: 7 %
Neutrophils Absolute: 2.5 10*3/uL (ref 1.4–7.0)
Neutrophils: 57 %
Platelets: 197 10*3/uL (ref 150–450)
RBC: 5.03 x10E6/uL (ref 3.77–5.28)
RDW: 12.3 % (ref 11.7–15.4)
WBC: 4.3 10*3/uL (ref 3.4–10.8)

## 2018-09-10 LAB — COMPREHENSIVE METABOLIC PANEL
ALT: 11 IU/L (ref 0–32)
AST: 12 IU/L (ref 0–40)
Albumin/Globulin Ratio: 2 (ref 1.2–2.2)
Albumin: 4.5 g/dL (ref 3.8–4.8)
Alkaline Phosphatase: 68 IU/L (ref 39–117)
BUN/Creatinine Ratio: 16 (ref 12–28)
BUN: 14 mg/dL (ref 8–27)
Bilirubin Total: 0.4 mg/dL (ref 0.0–1.2)
CO2: 26 mmol/L (ref 20–29)
Calcium: 9.9 mg/dL (ref 8.7–10.3)
Chloride: 101 mmol/L (ref 96–106)
Creatinine, Ser: 0.86 mg/dL (ref 0.57–1.00)
GFR calc Af Amer: 83 mL/min/{1.73_m2} (ref >59)
GFR calc non Af Amer: 72 mL/min/{1.73_m2} (ref >59)
Globulin, Total: 2.2 g/dL (ref 1.5–4.5)
Glucose: 90 mg/dL (ref 65–99)
Potassium: 5.5 mmol/L — ABNORMAL HIGH (ref 3.5–5.2)
Sodium: 142 mmol/L (ref 134–144)
Total Protein: 6.7 g/dL (ref 6.0–8.5)

## 2018-09-10 LAB — LIPID PANEL
Chol/HDL Ratio: 3.6 ratio (ref 0.0–4.4)
Cholesterol, Total: 185 mg/dL (ref 100–199)
HDL: 52 mg/dL (ref >39)
LDL Calculated: 116 mg/dL — ABNORMAL HIGH (ref 0–99)
Triglycerides: 85 mg/dL (ref 0–149)
VLDL Cholesterol Cal: 17 mg/dL (ref 5–40)

## 2018-09-10 LAB — VITAMIN B12: Vitamin B-12: 888 pg/mL (ref 232–1245)

## 2018-09-10 LAB — TSH: TSH: 3.04 u[IU]/mL (ref 0.450–4.500)

## 2018-09-10 LAB — VITAMIN D 25 HYDROXY (VIT D DEFICIENCY, FRACTURES): Vit D, 25-Hydroxy: 43.1 ng/mL (ref 30.0–100.0)

## 2018-09-10 LAB — HEMOGLOBIN A1C
Est. average glucose Bld gHb Est-mCnc: 111 mg/dL
Hgb A1c MFr Bld: 5.5 % (ref 4.8–5.6)

## 2018-09-22 ENCOUNTER — Other Ambulatory Visit: Payer: Self-pay | Admitting: Family Medicine

## 2018-09-22 ENCOUNTER — Telehealth: Payer: Self-pay | Admitting: Family Medicine

## 2018-09-22 NOTE — Telephone Encounter (Signed)
Forwarding message to medical assistant that Pt has a Ct Scan scheduled @ [place] on  09/24/18 @ 1pm that requires an Ins Co pre-authorization No#---   Has one been obtained & pls call [place] w/auth#.  --glh

## 2018-09-22 NOTE — Telephone Encounter (Signed)
Patient canceled the CT appt at this time. MPulliam, CMA/RT(R)

## 2018-09-24 ENCOUNTER — Ambulatory Visit: Payer: BC Managed Care – PPO

## 2018-12-29 ENCOUNTER — Other Ambulatory Visit: Payer: Self-pay | Admitting: Family Medicine

## 2019-01-04 ENCOUNTER — Telehealth: Payer: Self-pay | Admitting: Family Medicine

## 2019-01-04 NOTE — Telephone Encounter (Signed)
Patient called states it's time for annual enrollment & offers a discount for Smoking Cession Svcs patient wants to know if Provider/ Dr. Raliegh Scarlet performs this service.  --Forwarding message to medical assistant, I am unaware if SVC is offered here?  -Please contact pt w/answer [phone].  --glh

## 2019-01-05 NOTE — Telephone Encounter (Signed)
Pt states that she only needs a smoking cessation "session" with her PCP.  Advised pt to schedule appt.  Transferred to front desk to schedule appt.  Charyl Bigger, CMA

## 2019-01-05 NOTE — Telephone Encounter (Signed)
It depends on the specifics of what her [employer].  I do not have that insurance and hence, I do not know if going to see a primary care provider qualifies for her or not.   She will need to check with her medical insurance provider about the specifics.    Just an FYI: We as primary care providers do discuss smoking cessation with patients and treat tobacco abuse.

## 2019-01-05 NOTE — Telephone Encounter (Signed)
Is this something that is offered with Dr Raliegh Scarlet?

## 2019-01-14 ENCOUNTER — Ambulatory Visit: Payer: BC Managed Care – PPO | Admitting: Family Medicine

## 2019-01-25 ENCOUNTER — Ambulatory Visit (INDEPENDENT_AMBULATORY_CARE_PROVIDER_SITE_OTHER): Payer: BC Managed Care – PPO | Admitting: Family Medicine

## 2019-01-25 ENCOUNTER — Encounter: Payer: Self-pay | Admitting: Family Medicine

## 2019-01-25 ENCOUNTER — Other Ambulatory Visit: Payer: Self-pay

## 2019-01-25 VITALS — BP 159/94 | HR 66 | Temp 98.3°F | Resp 10 | Ht 70.5 in | Wt 163.3 lb

## 2019-01-25 DIAGNOSIS — F17210 Nicotine dependence, cigarettes, uncomplicated: Secondary | ICD-10-CM

## 2019-01-25 DIAGNOSIS — Z720 Tobacco use: Secondary | ICD-10-CM

## 2019-01-25 DIAGNOSIS — Z716 Tobacco abuse counseling: Secondary | ICD-10-CM

## 2019-01-25 DIAGNOSIS — Z7189 Other specified counseling: Secondary | ICD-10-CM

## 2019-01-25 NOTE — Patient Instructions (Addendum)
Please look up mindfulness meditation for smoking cessation online.  Also resume keeping a journal to write down triggers for smoking.       ---> In addition to the handout we gave you during your office visit please see if the information below it might be useful to you as well.    Please think seriously about quitting smoking!  This is very important for your health and well being.    Please let us know in the future if you are interested and ready to quit.   You can also call 1-800-QUIT-NOW (548)448-3407) for free smoking cessation counseling and support.     Also, please go online to www.heart.org (the American Heart Association website) and search "quit smoking ".     Or try the centers for disease control website at: https://www.schmidt.com/  Or, go to the "national cancer institute" web site of NIH:  http://benson.com/   There is a lot of great information on these websites for you to look over.      Want to Quit Smoking? FDA-Approved Products Can Help  Quitting smoking can be hard, but it is possible. In fact, every time you put out a cigarette is a new chance to try quitting again, according to the U.S. Food and Drug Administrations newest tobacco education campaign, Every Try Counts.   If you want to quit--almost 70 percent of adult smokers say they do--you may want to use a smoking cessation product proven to help. Data has shown that using FDA-approved cessation medicine can double your chance of quitting successfully.  Some products contain nicotine as an active ingredient and others do not. These products include over-the-counter (OTC) options like skin patches, lozenges, and gum, as well as prescription medicines.  Smoking cessation products are intended to help you quit smoking. They are regulated through the [facility] for Drug Evaluation and Research, which ensures  that the products are safe and effective and that their benefits outweigh any known associated risks.  The Benefits of Quitting Smoking No matter how much you smoke--or for how long--quitting will benefit you.  Not only will you lower your risk of getting various cancers, including lung cancer, youll also reduce your chances of having heart disease, a stroke, emphysema, and other serious diseases. Quitting also will lower the risk of heart disease and lung cancer in nonsmokers who otherwise would be exposed to your secondhand smoke.  Although there are benefits to quitting at any age, it is important to quit as soon as possible so your body can begin to heal from the damage caused by smoking. For instance, 12 hours after you quit smoking the carbon monoxide level in your blood drops to normal. Carbon monoxide is harmful because it displaces oxygen in the blood and deprives your heart, brain, and other vital organs of oxygen.  What To Know About Smoking Cessation Products Understanding how smoking cessation products work--and what side effects they may cause--can help you determine which product may be best for you.  If youre considering one of these products, reading labels and talking to your pharmacist and other health care providers are good first steps to take.  You also can check the FDAs website for more information on each product at Drugs@FDA , where you can search for each product by name.  And remember to weigh each products benefits and risks, among other considerations.  About Nicotine Replacement Therapy (NRT) Nicotine is the substance primarily responsible for causing addiction to tobacco products. Tobacco users who are  addicted to nicotine are used to having nicotine in their bodies.  As you try to quit smoking, you may have symptoms of nicotine withdrawal. When you quit, this withdrawal may cause symptoms like cravings, or urges, to smoke; depression; trouble sleeping;  irritability; anxiety; and increased appetite.  Nicotine withdrawal can discourage some smokers from continuing with a quit attempt. But the FDA has approved several smoking cessation products designed to help users gradually withdraw from smoking (that is, wean themselves from smoking) by using specific amounts of nicotine that decrease over time. This type of product is called a nicotine replacement therapy or NRT. It supplies nicotine in controlled amounts while sparing you from other chemicals found in tobacco products.  NRTs are available over the counter and by prescription. You should generally use them only for a short time to help you manage nicotine cravings and withdrawal. However, the FDA recognizes that some people may need to use these products longer to stay smoke-free. Talk to your health care provider to determine the best course of treatment for you.  Over-the-counter NRTs are approved for sale to people age 72 and older. They are available under various brand names and sometimes as generic products. They include:  - Skin patches (also called transdermal nicotine patches). These patches are placed on the skin, similar to how you would apply an adhesive bandage. - Chewing gum (also called nicotine gum). This gum must be chewed according to the labeled instructions to be effective. - Lozenges (also called nicotine lozenges). You use these products by dissolving them in your mouth. For over-the-counter products, its important to follow the instructions on the Drug Facts Label (DFL) and to read the enclosed Users Guide for complete directions and other important information. Ask your health care provider if you have questions.  Currently, prescription nicotine replacement therapy is available only under the brand name Nicotrol, and is available both as a nasal spray and an oral inhaler. The products are FDA-approved only for use by adults.  If you are under age 56 and want to  quit smoking, talk to a health care professional about whether you should use nicotine replacement therapies.  Important Advice for People Considering Nicotine Replacement Therapy Women who are pregnant or breastfeeding should talk to their health care providers and use nicotine replacement products only if the health care providers approve.  Also talk to your health care provider before using these products if you have:  diabetes, heart disease, asthma, or stomach ulcers; had a recent heart attack; high blood pressure that is not controlled with medicine; a history of irregular heartbeat; or been prescribed medication to help you quit smoking. If you take prescription medication for depression or asthma, tell your health care provider if you are quitting smoking because he or she may need to change your prescription dose.  Stop using a nicotine replacement product and call your health care professional if you have any of the following symptoms: nausea; dizziness; weakness; vomiting; fast or irregular heartbeat; mouth problems with the lozenge or gum; or redness or swelling of the skin around the patch that does not go away.  About Prescription Cessation Medicines Without Nicotine  The FDA has approved two smoking cessation products that do not contain nicotine. They are Chantix (varenicline tartrate) and Zyban (buproprion hydrochloride). Both are available in tablet form and by prescription only.  Chantix acts at sites in the brain affected by nicotine by reducing the rewarding effects of nicotine. The precise way that Zyban helps  with smoking cessation is unknown.  As with other prescription products, the FDA has evaluated these medicines and found that the benefits outweigh the risks. For users taking these products, risks include changes in behavior, depressed mood, hostility, aggression, and suicidal thoughts or actions.  The most common side effects of Chantix include nausea;  constipation; gas; vomiting; and trouble sleeping or vivid, unusual, or strange dreams. Chantix also may change how you react to alcohol, so talk to your health care provider about your drinking habits (if you drink alcohol) and whether these habits need to change. Chantix is not recommended for people under the age of 12.  The most commonly observed side effects consistently associated with the use of Zyban are dry mouth and insomnia.  Because Zyban contains the same active ingredient as the antidepressant Wellbutrin (bupropion), the FDA encourages people who use Zyban--and those who are considering it--to talk to their health care providers about the risks of treatment with antidepressant medicines. Zyban has not been studied in children under the age of 66 and is not approved for use in children and teenagers.  Note: If your health care provider prescribes Chantix or Zyban, please read the products patient medication guide in its entirety. These guides offer important information on side effects, risks, warnings, product ingredients, and what you should talk about with your health care provider before taking the products.  Finally, if you ever have any side effects related to any smoking cessation products, or have any other problems related to your treatment, the FDA would like to hear from you. Please consider making a voluntary and confidential report to the [facility] MedWatch program.  Updated: February 26, 2016

## 2019-01-25 NOTE — Progress Notes (Signed)
Impression and Recommendations:    1. Tobacco abuse   2. Tobacco abuse counseling   3. Counseling on health promotion and disease prevention     -  In the office with patient today for 30 minutes with more than 25 minutes of direct face-to-face counseling for smoking cessation.  Tobacco Abuse/Counseling, Counseling on Health Promotion & Disease Prevention - Told pt to think seriously about quitting smoking.  Told pt it is very important for his/her health and well being.   - Smoking cessation instruction/ counseling given of at least 5 minutes: counseled patient on the dangers of tobacco use and reviewed strategies to maximize success.  Extensive education provided and all questions answered.  - Discussed with patient that there are multiple treatments to aid in quitting smoking, however I explained none will work unless pt really wants to quit.  - Advised incorporating mindfulness meditation online to aid with smoking cessation.  - Encouraged patient to keep a journal to resume tracking/recognizing triggers for her urge to smoke. - Long conversation held with patient regarding need to track and avoid triggers as often as possible.  - Reviewed strategy of replacing habit of smoking with habit such as exercise or walking.  To replace the oral fixation, advised chewing on a harmless object such as a coffee stirrer instead of a cigarette.  To replace the urge to anxiously use cigarettes, use Play-Doh or a fidget spinner to occupy the mind, etc.  - Additionally encouraged patient to think of all the funds she will save instead of spending on cigarettes, and focus on allocating that budget toward a vacation or purchase she's been dreaming about.  - Discussed options for prescription intervention such as Chantix and Wellbutrin PRN.  - Told to call 1-800-QUIT-NOW (250) 821-7497) for free smoking cessation counseling and support, or pt can go online to www.heart.org - the American Heart  Association website and search "quit smoking "  - Will continue to monitor closely and offer support PRN.  Recommendations - Need for return in near future (2 weeks) for blood pressure & chronic follow-up.  - Patient will call her insurance and ask what they cover regarding options to quit smoking.     No orders of the defined types were placed in this encounter.   No orders of the defined types were placed in this encounter.   There are no discontinued medications.   Gross side effects, risk and benefits, and alternatives of medications and treatment plan in general discussed with patient.  Patient is aware that all medications have potential side effects and we are unable to predict every side effect or drug-drug interaction that may occur.   Patient will call with any questions prior to using medication if they have concerns.    Expresses verbal understanding and consents to current therapy and treatment regimen.  No barriers to understanding were identified.  Red flag symptoms and signs discussed in detail.  Patient expressed understanding regarding what to do in case of emergency\urgent symptoms  Please see AVS handed out to patient at the end of our visit for further patient instructions/ counseling done pertaining to today's office visit.   Return for f/up in 2-4 weeks to address blood pressure and chronic health issues.     Note:  This note was prepared with assistance of Dragon voice recognition software. Occasional wrong-word or sound-a-like substitutions may have occurred due to the inherent limitations of voice recognition software.   This document serves as a  record of services personally performed by Mellody Dance, DO. It was created on her behalf by Toni Amend, a trained medical scribe. The creation of this record is based on the scribe's personal observations and the provider's statements to them.   This case required medical decision making of at least  moderate complexity. The above documentation has been reviewed to be accurate and was completed by [name], D.O.      --------------------------------------------------------------------------------------------------------------------------------------------------------------------------------------------------------------------------------------------    Subjective:    Savannah Mendez, am serving as scribe for Dr. Mellody Dance.  HPI: Savannah Mendez is a 63 y.o. female who presents to [place] [facility] today for issues as discussed below.  Says she is only here today for smoking cessation.  Notes her blood pressure is part of the reason she wants to quit smoking.  - Desire to Quit Smoking Says "I've got to do this."  She wants to quit smoking because 'I'm just tired of it.  It costs too much, it's just really a nuisance, and I feel like I've wasted time, but I think it's a substitute because I don't know what to do [about stress]."  Says "I'm no longer taking care of my mother, she passed away two years ago, and we're just now closing on her house next week."  Indicates that now that this is off her plate, she feels she will be better able to quit smoking.  Her sister was diagnosed with acute leukemia a little over a month ago; "it starts with an M."  She's been receiving treatment in [facility] and has left the patient "with everything at the house."  Patient notes she's been "a bundle of nerves with everything going on."  However, her sister has gone into remission with treatment, and patient states that she feels this has alleviated some stress.  Patient thinks it will affect her health if she continues smoking; increasing her chances of lung cancer, stroke, and other health complications.  Notes "There's so many reasons to quit."  Says "I just need to do it.  I need to be there for my loved ones."  - History of Quitting She has tried to  quit smoking in the past.  Quit in 2008 for almost three years; "I just got it in my mind; I listened to tapes, documented every time I smoked a cigarette, the triggers."  Says "you just have to think about that before you smoke: this is my trigger, this doesn't help me."  Notes with other stressors in life, she started back smoking, "and I shouldn't have, because I really didn't want [a cigarette] after I'd gotten it in my mind."  Notes "not into the Chantix."  Says she may still have her smoking cessation tapes at home.  Notes she has many reasons she wishes to quit smoking.  Hopes with the weather getting colder, she will feel less of an urge to go outside to smoke.  Also notes new neighbors moving in with a little girl, and she wishes to not be a "bad influence."   Wt Readings from Last 3 Encounters:  01/25/19 163 lb 4.8 oz (74.1 kg)  09/09/18 165 lb 3.2 oz (74.9 kg)  02/05/18 166 lb (75.3 kg)   BP Readings from Last 3 Encounters:  01/25/19 (!) 159/94  09/09/18 120/80  02/05/18 130/89   Pulse Readings from Last 3 Encounters:  01/25/19 66  09/09/18 70  02/05/18 66   BMI Readings from Last 3 Encounters:  01/25/19 23.10 kg/m  09/09/18 23.70 kg/m  02/05/18 23.15 kg/m     Patient Care Team    Relationship Specialty Notifications Start End  Mellody Dance, DO PCP - General Family Medicine  02/04/17   Jari Pigg, MD Consulting Physician Dermatology  02/04/17   Marygrace Drought, MD Consulting Physician Ophthalmology  02/04/17   Nelwyn Salisbury, [place] Physician Assistant Obstetrics and Gynecology  02/10/17   Richmond Campbell, MD Consulting Physician Gastroenterology  07/08/17      Patient Active Problem List   Diagnosis Date Noted   Tobacco abuse 02/05/2018    Priority: High   Hypertension 02/04/2017    Priority: High   HLD (hyperlipidemia) 02/04/2017    Priority: High   Glucose intolerance (impaired glucose tolerance) 03/27/2017    Priority: Medium   Statin  intolerance- only able to tolerate small dose 03/27/2017   Chronic pain of right knee 03/27/2017   Adjustment disorder with mixed anxiety and depressed mood 02/04/2017   Postmenopausal- 2008 02/04/2017   S/P colonoscopy- 2010- N 02/04/2017   h/o Thyroid nodule- R s/p Bx 2016 or so 02/04/2017    Past Medical history, Surgical history, Family history, Social history, Allergies and Medications have been entered into the medical record, reviewed and changed as needed.    Current Meds  Medication Sig   amlodipine-benazepril (LOTREL) 2.5-10 MG capsule Take 1 capsule by mouth daily.   Cholecalciferol (VITAMIN D3) 5000 units CAPS Take 1 capsule by mouth daily.   Coenzyme Q10 (COQ10 PO) Take 1 capsule by mouth daily.   Omega 3 1200 MG CAPS Take 1 capsule by mouth daily.   rosuvastatin (CRESTOR) 20 MG tablet TAKE 1 TABLET (20 MG TOTAL) BY MOUTH AT BEDTIME.    Allergies:  Allergies  Allergen Reactions   Paxil [Paroxetine]      Review of Systems:  A fourteen system review of systems was performed and found to be positive as per HPI.   Objective:   Blood pressure (!) 159/94, pulse 66, temperature 98.3 F (36.8 C), temperature source Oral, resp. rate 10, height 5' 10.5" (1.791 m), weight 163 lb 4.8 oz (74.1 kg), SpO2 98 %. Body mass index is 23.1 kg/m. General:  Well Developed, well nourished, appropriate for stated age.  Neuro:  Alert and oriented,  extra-ocular muscles intact  HEENT:  Normocephalic, atraumatic, neck supple, no carotid bruits appreciated  Skin:  no gross rash, warm, pink. Cardiac:  RRR, S1 S2 Respiratory:  ECTA B/L and A/P, Not using accessory muscles, speaking in full sentences- unlabored. Vascular:  Ext warm, no cyanosis apprec.; cap RF less 2 sec. Psych:  No HI/SI, judgement and insight good, Euthymic mood. Full Affect.

## 2019-03-04 ENCOUNTER — Ambulatory Visit: Payer: BC Managed Care – PPO | Admitting: Family Medicine

## 2019-03-10 ENCOUNTER — Ambulatory Visit: Payer: BC Managed Care – PPO | Admitting: Family Medicine

## 2019-03-31 ENCOUNTER — Other Ambulatory Visit: Payer: Self-pay | Admitting: Family Medicine

## 2019-05-06 ENCOUNTER — Ambulatory Visit (INDEPENDENT_AMBULATORY_CARE_PROVIDER_SITE_OTHER): Payer: BC Managed Care – PPO | Admitting: Family Medicine

## 2019-05-06 ENCOUNTER — Encounter: Payer: Self-pay | Admitting: Family Medicine

## 2019-05-06 ENCOUNTER — Other Ambulatory Visit: Payer: Self-pay

## 2019-05-06 VITALS — BP 109/73 | Ht 70.5 in | Wt 163.0 lb

## 2019-05-06 DIAGNOSIS — Z789 Other specified health status: Secondary | ICD-10-CM

## 2019-05-06 DIAGNOSIS — E785 Hyperlipidemia, unspecified: Secondary | ICD-10-CM

## 2019-05-06 DIAGNOSIS — F4323 Adjustment disorder with mixed anxiety and depressed mood: Secondary | ICD-10-CM

## 2019-05-06 DIAGNOSIS — I10 Essential (primary) hypertension: Secondary | ICD-10-CM | POA: Diagnosis not present

## 2019-05-06 DIAGNOSIS — R7302 Impaired glucose tolerance (oral): Secondary | ICD-10-CM

## 2019-05-06 DIAGNOSIS — Z720 Tobacco use: Secondary | ICD-10-CM

## 2019-05-06 MED ORDER — ROSUVASTATIN CALCIUM 10 MG PO TABS: 10.0000 mg | ORAL_TABLET | Freq: Every day | ORAL | 0 refills | Status: DC

## 2019-05-06 MED ORDER — AMLODIPINE BESY-BENAZEPRIL HCL 2.5-10 MG PO CAPS: 1.0000 | ORAL_CAPSULE | Freq: Every day | ORAL | 0 refills | Status: DC

## 2019-05-06 NOTE — Progress Notes (Signed)
Telehealth office visit note for Savannah Mendez, D.O- at [facility] Care at [facility]   I connected with current patient today and verified that I am speaking with the correct person using two identifiers.   . Location of the patient: Home . Location of the provider: Office Only the patient (+/- their family members at pt's discretion) and myself were participating in the encounter - This visit type was conducted due to national recommendations for restrictions regarding the COVID-19 Pandemic (e.g. social distancing) in an effort to limit this patient's exposure and mitigate transmission in our community.  This format is felt to be most appropriate for this patient at this time.   - No physical exam could be performed with this format, beyond that communicated to [place] by the patient/ family members as noted.   - Additionally my office staff/ schedulers discussed with the patient that there may be a monetary charge related to this service, depending on their medical insurance.   The patient expressed understanding, and agreed to proceed.       History of Present Illness: Hypertension and Hyperlipidemia   I, Savannah Mendez, am serving as Education administrator for [name].  Patient notes today that she's had to reschedule her appointment several times due to various reasons.  Notes she has to take her medications all at the same time, in the mornings.  Remarks that it's been harder for her to exercise lately due to the weather.  - Tobacco Abuse She has not worked on smoking cessation through all of her family stress lately, etc. Says "maybe if things settle down a bit," she will try again, as this is still a goal for her.  HPI:  Hypertension:  -  Her blood pressure at home has been running: 109/73; thinks it usually runs a bit higher than this.  "There's been so much going on with our family, the holidays and all that, I guess I'm a little worried and feeling down, but that's life; you have  ups and downs."  - Patient reports good compliance with medication and/or lifestyle modification  - Her denies acute concerns or problems related to treatment plan  - She denies new onset of: chest pain, exercise intolerance, shortness of breath, dizziness, visual changes, headache, lower extremity swelling or claudication.   Last 3 blood pressure readings in our office are as follows: BP Readings from Last 3 Encounters:  05/06/19 109/73  01/25/19 (!) 159/94  09/09/18 120/80   Filed Weights   05/06/19 0807  Weight: 163 lb (73.9 kg)    HPI:  Hyperlipidemia:  64 y.o. female here for cholesterol follow-up.   - Patient reports good compliance with treatment plan of:  medication and/ or lifestyle management.    Says she can't remember to take her medication at night.  Notes "I'm a muncher; I munch here, there, and everywhere."  - Patient denies any acute concerns or problems with management plan   - She denies new onset of: myalgias, arthralgias, increased fatigue more than normal, chest pains, exercise intolerance, shortness of breath, dizziness, visual changes, headache, lower extremity swelling or claudication.   Most recent cholesterol panel was:  Lab Results  Component Value Date   CHOL 185 09/09/2018   HDL 52 09/09/2018   LDLCALC 116 (H) 09/09/2018   TRIG 85 09/09/2018   CHOLHDL 3.6 09/09/2018   Hepatic Function Latest Ref Rng & Units 09/09/2018 02/02/2018 07/08/2017  Total Protein 6.0 - 8.5 g/dL 6.7 - 7.0  Albumin 3.8 - 4.8 g/dL 4.5 - 4.6  AST 0 - 40 IU/L 12 - 12  ALT 0 - 32 IU/L _0 Alk Phosphatase 39 - 117 IU/L 68 - 73  Total Bilirubin 0.0 - 1.2 mg/dL 0.4 - 0.4   Last A1C in the office was:  Lab Results  Component Value Date   HGBA1C 5.5 09/09/2018   HGBA1C 5.6 07/08/2017   HGBA1C 5.7 (H) 03/21/2017   Lab Results  Component Value Date   LDLCALC 116 (H) 09/09/2018   CREATININE 0.86 09/09/2018    Wt Readings from Last 3 Encounters:  05/06/19 163 lb  (73.9 kg)  01/25/19 163 lb 4.8 oz (74.1 kg)  09/09/18 165 lb 3.2 oz (74.9 kg)     No flowsheet data found.  Depression screen [facility] 2/9 05/06/2019 01/25/2019 09/09/2018 02/05/2018 12/04/2017  Decreased Interest 0 _1 0  Down, Depressed, Hopeless 1 0 0 0 0  PHQ - 2 Score _2 0  Altered sleeping 0 0 _3 Tired, decreased energy _4 Change in appetite 0 0 1 0 0  Feeling bad or failure about yourself  0 0 0 0 0  Trouble concentrating 0 1 0 0 0  Moving slowly or fidgety/restless 0 0 0 0 0  Suicidal thoughts 0 0 0 0 0  PHQ-9 Score _5 Difficult doing work/chores Not difficult at all Somewhat difficult Somewhat difficult Somewhat difficult Somewhat difficult  Some recent data might be hidden      Impression and Recommendations:    1. Essential hypertension   2. Hyperlipidemia, unspecified hyperlipidemia type   3. Statin intolerance- only able to tolerate small dose   4. Glucose intolerance (impaired glucose tolerance)   5. Tobacco abuse   6. Adjustment disorder with mixed anxiety and depressed mood      Glucose Intolerance - Stable at this time, 5.5 last check seven months ago. - Continue management as established. - Will continue to monitor and re-check as discussed.   Essential Hypertension - Blood pressure currently is stable, at goal. - Patient will continue current treatment regimen.  See med list.  - Counseled patient on pathophysiology of disease and discussed various treatment options, which always includes dietary and lifestyle modification as first line.   - Lifestyle changes such as dash and heart healthy diets and engaging in a regular exercise program discussed extensively with patient.   - Ambulatory blood pressure monitoring encouraged at least 3 times weekly.  Keep log and bring in every office visit.  Reminded patient that if they ever feel poorly in any way, to check her blood pressure and pulse.  - We will continue to monitor.    Hyperlipidemia - Statin intolerance, only able to tolerate small dose - Last FLP obtained 7 months ago: LDL = 116, improved from 122, 160 prior. HDL = 52, stable from 50 prior. Triglycerides = 85, stable from 85 prior.  - Encouraged patient to take her cholesterol medication regularly, at night if possible.  - Per patient, due to intolerance, "tries to take the least amount as I can."   - Continue half of 20 mg tablet as established.  See med list.  - Dietary changes such as low saturated & trans fat diets for hyperlipidemia and low carb diets for hypertriglyceridemia discussed with patient.    - Encouraged patient to follow AHA guidelines for regular exercise.  - We  will continue to monitor.   Adjustment Disorder w/ Mixed Anxiety & Depressed Mood - Stable at this time, despite recent acute stress. - Continue management as established.  - Especially in stressful times, reviewed the "spokes of the wheel" of mood and health management.  Stressed the importance of ongoing prudent habits, including regular exercise, appropriate sleep hygiene, healthful dietary habits, and prayer/meditation to relax.  - Patient knows she may call for additional assistance at any time as desired. - Will continue to monitor.   Tobacco Abuse & Smoking Cessation - Told pt to think seriously about quitting smoking!  Told pt it is very important for her health and well being.  - Discussed with patient that there are multiple treatments to aid in quitting smoking, however I explained none will work unless pt really wants to quit - Will continue to monitor and encourage patient to quit when she feels ready.   COVID-19 Vaccination Counseling - Recommended obtaining the COVID-19 vaccine whenever it becomes available. - Discussed expectations for vaccination moving forward. - Education provided and all questions answered.   Health Counseling & Preventative Maintenance - Advised patient to continue working  toward exercising to improve overall mental, physical, and emotional health.    - Encouraged patient to engage in daily physical activity as tolerated, especially a formal exercise routine.  Recommended that the patient eventually strive for at least 150 minutes of moderate cardiovascular activity per week according to guidelines established by the [facility].   - Healthy dietary habits encouraged, including low-carb, and high amounts of lean protein in diet.   - Patient should also consume adequate amounts of water.  - Health counseling performed.  All questions answered.   Recommendations - Repeat blood work and CPE same day late June (after 6/25), early July.   - As part of my medical decision making, I reviewed the following data within the [place] History obtained from pt /family, CMA notes reviewed and incorporated if applicable, Labs reviewed, Radiograph/ tests reviewed if applicable and OV notes from prior OV's with me, as well as other specialists she/he has seen since seeing me last, were all reviewed and used in my medical decision making process today.    - Additionally, discussion had with patient regarding our treatment plan, and their biases/concerns about that plan were used in my medical decision making today.    - The patient agreed with the plan and demonstrated an understanding of the instructions.   No barriers to understanding were identified.    - Red flag symptoms and signs discussed in detail.  Patient expressed understanding regarding what to do in case of emergency_0 urgent symptoms.   - The patient was advised to call back or seek an in-person evaluation if the symptoms worsen or if the condition fails to improve as anticipated.   Return for repeat blood work and CPE same day late June past 6/24 or early July.    Meds ordered this encounter  Medications  . rosuvastatin (CRESTOR) 10 MG tablet    Sig: Take 1 tablet (10 mg total) by mouth at  bedtime.    Dispense:  90 tablet    Refill:  0  . amlodipine-benazepril (LOTREL) 2.5-10 MG capsule    Sig: Take 1 capsule by mouth daily.    Dispense:  90 capsule    Refill:  0    Medications Discontinued During This Encounter  Medication Reason  . rosuvastatin (CRESTOR) 20 MG tablet   . amlodipine-benazepril (LOTREL)  2.5-10 MG capsule Reorder      I provided 23 minutes of non face-to-face time during this encounter.  Additional time was spent with charting and coordination of care before and after the actual visit commenced.   Note:  This note was prepared with assistance of Dragon voice recognition software. Occasional wrong-word or sound-a-like substitutions may have occurred due to the inherent limitations of voice recognition software.   This document serves as a record of services personally performed by Savannah Dance, DO. It was created on her behalf by Savannah Mendez, a trained medical scribe. The creation of this record is based on the scribe's personal observations and the provider's statements to them.   This case required medical decision making of at least moderate complexity.  This document serves as a record of services personally performed by Savannah Dance, DO. It was created on her behalf by Savannah Mendez, a trained medical scribe. The creation of this record is based on the scribe's personal observations and the provider's statements to them.   This case required medical decision making of at least moderate complexity. The above documentation from Savannah Mendez, medical scribe, has been reviewed by [name], D.O.        Patient Care Team    Relationship Specialty Notifications Start End  Savannah Dance, DO PCP - General Family Medicine  02/04/17   Jari Pigg, MD Consulting Physician Dermatology  02/04/17   Marygrace Drought, MD Consulting Physician Ophthalmology  02/04/17   Nelwyn Salisbury, [place] Physician Assistant Obstetrics and  Gynecology  02/10/17   Richmond Campbell, MD Consulting Physician Gastroenterology  07/08/17      -Vitals obtained; medications/ allergies reconciled;  personal medical, social, Sx etc.histories were updated by CMA, reviewed by me and are reflected in chart   Patient Active Problem List   Diagnosis Date Noted  . Tobacco abuse 02/05/2018  . Hypertension 02/04/2017  . HLD (hyperlipidemia) 02/04/2017  . Glucose intolerance (impaired glucose tolerance) 03/27/2017  . Statin intolerance- only able to tolerate small dose 03/27/2017  . Chronic pain of right knee 03/27/2017  . Adjustment disorder with mixed anxiety and depressed mood 02/04/2017  . Postmenopausal- 2008 02/04/2017  . S/P colonoscopy- 2010- N 02/04/2017  . h/o Thyroid nodule- R s/p Bx 2016 or so 02/04/2017     Current Meds  Medication Sig  . amlodipine-benazepril (LOTREL) 2.5-10 MG capsule Take 1 capsule by mouth daily.  . Cholecalciferol (VITAMIN D3) 5000 units CAPS Take 1 capsule by mouth daily.  . Coenzyme Q10 (COQ10 PO) Take 1 capsule by mouth daily.  . Omega 3 1200 MG CAPS Take 1 capsule by mouth daily.  . rosuvastatin (CRESTOR) 10 MG tablet Take 1 tablet (10 mg total) by mouth at bedtime.  . Zinc 50 MG CAPS Take by mouth.  . [DISCONTINUED] amlodipine-benazepril (LOTREL) 2.5-10 MG capsule Take 1 capsule by mouth daily. **PATIENT NEEDS APT FOR FURTHER REFILLS**  . [DISCONTINUED] rosuvastatin (CRESTOR) 20 MG tablet Take 1 tablet (20 mg total) by mouth at bedtime. **PATIENT NEEDS APT FOR FUTURE REFILLS**     Allergies:  Allergies  Allergen Reactions  . Paxil [Paroxetine]      ROS:  See above HPI for pertinent positives and negatives   Objective:   Blood pressure 109/73, height 5' 10.5" (1.791 m), weight 163 lb (73.9 kg).  (if some vitals are omitted, this means that patient was UNABLE to obtain them even though they were asked to get them prior to OV today.  They were  asked to call us at their earliest convenience  with these once obtained. )  General: A & O * 3; sounds in no acute distress; in usual state of health.  Skin: Pt confirms warm and dry extremities and pink fingertips HEENT: Pt confirms lips non-cyanotic Chest: Patient confirms normal chest excursion and movement Respiratory: speaking in full sentences, no conversational dyspnea; patient confirms no use of accessory muscles Psych: insight appears good, mood- appears full

## 2019-06-03 ENCOUNTER — Ambulatory Visit: Payer: BC Managed Care – PPO | Attending: Internal Medicine

## 2019-06-03 DIAGNOSIS — Z23 Encounter for immunization: Secondary | ICD-10-CM

## 2019-06-03 NOTE — Progress Notes (Signed)
   Covid-19 Vaccination Clinic  Name:  Savannah Mendez    MRN: [id number] DOB: Oct 01, 1955  06/03/2019  [name] was observed post Covid-19 immunization for 15 minutes without incident. She was provided with Vaccine Information Sheet and instruction to access the V-Safe system.   [name] was instructed to call 911 with any severe reactions post vaccine: Marland Kitchen Difficulty breathing  . Swelling of face and throat  . A fast heartbeat  . A bad rash all over body  . Dizziness and weakness   Immunizations Administered    Name Date Dose VIS Date Route   Pfizer COVID-19 Vaccine 06/03/2019 10:44 AM 0.3 mL 02/26/2019 Intramuscular   Manufacturer: [place]   Lot: [id number]   [place]: [id number]

## 2019-06-28 ENCOUNTER — Ambulatory Visit: Payer: BC Managed Care – PPO | Attending: Internal Medicine

## 2019-06-28 DIAGNOSIS — Z23 Encounter for immunization: Secondary | ICD-10-CM

## 2019-06-28 NOTE — Progress Notes (Signed)
   Covid-19 Vaccination Clinic  Name:  Savannah Mendez    MRN: [id number] DOB: December 09, 1955  06/28/2019  [name] was observed post Covid-19 immunization for 15 minutes without incident. She was provided with Vaccine Information Sheet and instruction to access the V-Safe system.   [name] was instructed to call 911 with any severe reactions post vaccine: Marland Kitchen Difficulty breathing  . Swelling of face and throat  . A fast heartbeat  . A bad rash all over body  . Dizziness and weakness   Immunizations Administered    Name Date Dose VIS Date Route   Pfizer COVID-19 Vaccine 06/28/2019 10:49 AM 0.3 mL 02/26/2019 Intramuscular   Manufacturer: [place]   Lot: [id number]   [place]: [id number]

## 2019-08-24 ENCOUNTER — Telehealth: Payer: Self-pay | Admitting: Physician Assistant

## 2019-08-24 NOTE — Telephone Encounter (Signed)
Patient called.  She has a new patient appointment with Dr.Cody on 09/14/19. Patient checked with [facility] and they said Dr.Cody isn't listed as a participating provider.  Patient wanted to make sure before her appointment that Dr.Cody is listed with [facility].  Patient's switching to [employer] in August.  Please call patient.

## 2019-08-25 ENCOUNTER — Other Ambulatory Visit: Payer: Self-pay | Admitting: Family Medicine

## 2019-08-27 NOTE — Telephone Encounter (Signed)
Called and discussed with patient. As far as I have seen, as long as the other providers come up as participating that there shouldn't be an issue.

## 2019-09-06 ENCOUNTER — Other Ambulatory Visit: Payer: Self-pay | Admitting: Physician Assistant

## 2019-09-06 DIAGNOSIS — Z0000 Encounter for general adult medical examination without abnormal findings: Secondary | ICD-10-CM

## 2019-09-06 DIAGNOSIS — I10 Essential (primary) hypertension: Secondary | ICD-10-CM

## 2019-09-06 DIAGNOSIS — E782 Mixed hyperlipidemia: Secondary | ICD-10-CM

## 2019-09-06 DIAGNOSIS — E785 Hyperlipidemia, unspecified: Secondary | ICD-10-CM

## 2019-09-06 DIAGNOSIS — E041 Nontoxic single thyroid nodule: Secondary | ICD-10-CM

## 2019-09-10 ENCOUNTER — Other Ambulatory Visit: Payer: BC Managed Care – PPO

## 2019-09-10 ENCOUNTER — Other Ambulatory Visit: Payer: Self-pay

## 2019-09-10 DIAGNOSIS — I10 Essential (primary) hypertension: Secondary | ICD-10-CM

## 2019-09-10 DIAGNOSIS — E782 Mixed hyperlipidemia: Secondary | ICD-10-CM

## 2019-09-10 DIAGNOSIS — Z0000 Encounter for general adult medical examination without abnormal findings: Secondary | ICD-10-CM

## 2019-09-10 DIAGNOSIS — E041 Nontoxic single thyroid nodule: Secondary | ICD-10-CM

## 2019-09-10 DIAGNOSIS — E785 Hyperlipidemia, unspecified: Secondary | ICD-10-CM

## 2019-09-11 LAB — COMPREHENSIVE METABOLIC PANEL
ALT: 13 IU/L (ref 0–32)
AST: 13 IU/L (ref 0–40)
Albumin/Globulin Ratio: 1.7 (ref 1.2–2.2)
Albumin: 4.4 g/dL (ref 3.8–4.8)
Alkaline Phosphatase: 74 IU/L (ref 48–121)
BUN/Creatinine Ratio: 15 (ref 12–28)
BUN: 13 mg/dL (ref 8–27)
Bilirubin Total: 0.4 mg/dL (ref 0.0–1.2)
CO2: 26 mmol/L (ref 20–29)
Calcium: 9.8 mg/dL (ref 8.7–10.3)
Chloride: 101 mmol/L (ref 96–106)
Creatinine, Ser: 0.87 mg/dL (ref 0.57–1.00)
GFR calc Af Amer: 81 mL/min/{1.73_m2} (ref >59)
GFR calc non Af Amer: 71 mL/min/{1.73_m2} (ref >59)
Globulin, Total: 2.6 g/dL (ref 1.5–4.5)
Glucose: 91 mg/dL (ref 65–99)
Potassium: 5.3 mmol/L — ABNORMAL HIGH (ref 3.5–5.2)
Sodium: 138 mmol/L (ref 134–144)
Total Protein: 7 g/dL (ref 6.0–8.5)

## 2019-09-11 LAB — LIPID PANEL
Chol/HDL Ratio: 3.3 ratio (ref 0.0–4.4)
Cholesterol, Total: 193 mg/dL (ref 100–199)
HDL: 58 mg/dL (ref >39)
LDL Chol Calc (NIH): 123 mg/dL — ABNORMAL HIGH (ref 0–99)
Triglycerides: 65 mg/dL (ref 0–149)
VLDL Cholesterol Cal: 12 mg/dL (ref 5–40)

## 2019-09-11 LAB — CBC
Hematocrit: 45.4 % (ref 34.0–46.6)
Hemoglobin: 15.3 g/dL (ref 11.1–15.9)
MCH: 31.3 pg (ref 26.6–33.0)
MCHC: 33.7 g/dL (ref 31.5–35.7)
MCV: 93 fL (ref 79–97)
Platelets: 191 10*3/uL (ref 150–450)
RBC: 4.89 x10E6/uL (ref 3.77–5.28)
RDW: 12.1 % (ref 11.7–15.4)
WBC: 4.7 10*3/uL (ref 3.4–10.8)

## 2019-09-11 LAB — HEMOGLOBIN A1C
Est. average glucose Bld gHb Est-mCnc: 114 mg/dL
Hgb A1c MFr Bld: 5.6 % (ref 4.8–5.6)

## 2019-09-11 LAB — TSH: TSH: 2.11 u[IU]/mL (ref 0.450–4.500)

## 2019-09-13 ENCOUNTER — Other Ambulatory Visit: Payer: Self-pay

## 2019-09-13 ENCOUNTER — Encounter: Payer: Self-pay | Admitting: Physician Assistant

## 2019-09-13 ENCOUNTER — Ambulatory Visit (INDEPENDENT_AMBULATORY_CARE_PROVIDER_SITE_OTHER): Payer: BC Managed Care – PPO | Admitting: Physician Assistant

## 2019-09-13 ENCOUNTER — Other Ambulatory Visit (HOSPITAL_COMMUNITY)
Admission: RE | Admit: 2019-09-13 | Discharge: 2019-09-13 | Disposition: A | Discharge status: Home / Self Care | Payer: BC Managed Care – PPO | Source: Ambulatory Visit | Attending: Physician Assistant | Admitting: Physician Assistant

## 2019-09-13 VITALS — BP 113/80 | HR 77 | Temp 98.5°F | Ht 69.75 in | Wt 169.6 lb

## 2019-09-13 DIAGNOSIS — Z1211 Encounter for screening for malignant neoplasm of colon: Secondary | ICD-10-CM | POA: Diagnosis not present

## 2019-09-13 DIAGNOSIS — I10 Essential (primary) hypertension: Secondary | ICD-10-CM | POA: Diagnosis not present

## 2019-09-13 DIAGNOSIS — Z124 Encounter for screening for malignant neoplasm of cervix: Secondary | ICD-10-CM | POA: Diagnosis not present

## 2019-09-13 DIAGNOSIS — Z720 Tobacco use: Secondary | ICD-10-CM

## 2019-09-13 DIAGNOSIS — Z716 Tobacco abuse counseling: Secondary | ICD-10-CM

## 2019-09-13 DIAGNOSIS — E785 Hyperlipidemia, unspecified: Secondary | ICD-10-CM

## 2019-09-13 DIAGNOSIS — Z0000 Encounter for general adult medical examination without abnormal findings: Secondary | ICD-10-CM | POA: Diagnosis not present

## 2019-09-13 DIAGNOSIS — Z122 Encounter for screening for malignant neoplasm of respiratory organs: Secondary | ICD-10-CM

## 2019-09-13 DIAGNOSIS — Z1159 Encounter for screening for other viral diseases: Secondary | ICD-10-CM

## 2019-09-13 NOTE — Progress Notes (Signed)
Female Physical   Impression and Recommendations:    1. Screening for colon cancer   2. Screening for cervical cancer   3. Encounter for screening for lung cancer      1) Anticipatory Guidance: Discussed skin CA prevention and sunscreen when outside along with skin surveillance; eating a balanced and modest diet; physical activity at least 25 minutes per day or minimum of 150 min/ week moderate to intense activity. -Patient has been more active doing yard work.  2) Immunizations / Screenings / Labs:   All immunizations are up-to-date per recommendations or will be updated today if pt allows.    - Patient understands with dental and vision screens they will schedule independently.  -Obtained CBC, CMP, HgA1c, Lipid panel, and TSH  when fasting, if not already done past 12 mo/ recently.  Most labs are within normal limits or stable from prior.  Patient reports she did miss a few doses of Crestor. -Up-to-date on Tdap. Declined Shingrix at this time. -Placed order for hep C screening. -Declined HIV screening. -Placed order for Cologuard. -Mammogram scheduled for 09/27/19.  3) Weight:  BMI meaning discussed with patient.  Discussed goal to improve diet habits to improve overall feelings of well being and objective health data. Improve nutrient density of diet through increasing intake of fruits and vegetables and decreasing saturated fats, white flour products and refined sugars. -Patient has been on the road a lot and has been eating out more than usual.  4) Healthcare maintenance: -Continue current medication regimen.  Advised to not miss doses of her medications. -Follow heart healthy diet and decrease ice cream consumption. -Stay as active as possible. -Stay well-hydrated, at least 64 fluid ounces. -Advised to reduce smoking use. Educated patient on the resources available to help with smoking cessation. Patient states she wants to quit smoking but is not ready yet. -Follow-up in 6  months for regular office visit on hypertension and hyperlipidemia.  No orders of the defined types were placed in this encounter.   Orders Placed This Encounter  Procedures  . CT CHEST LUNG CA SCREEN LOW DOSE W/O CM  . Cologuard     No follow-ups on file.    Gross side effects, risk and benefits, and alternatives of medications discussed with patient.  Patient is aware that all medications have potential side effects and we are unable to predict every side effect or drug-drug interaction that may occur.  Expresses verbal understanding and consents to current therapy plan and treatment regimen.  F-up preventative CPE in 1 year-  this is in addition to any chronic care visits.    Please see orders placed and AVS handed out to patient at the end of our visit for further patient instructions/ counseling done pertaining to today's office visit.    Subjective:     CPE HPI: Savannah Mendez is a 64 y.o. female who presents to [place] at [facility] today for a yearly health maintenance exam.   Health Maintenance Summary  - Reviewed and updated, unless pt declines services.  Last Cologuard or Colonoscopy:   Placed order for Cologuard. Family history of Colon CA: No  Tobacco History Reviewed:  Y, current smoker 2 PPD CT scan for screening lung CA: Placed order. Alcohol and/or drug use: No concerns; no use Dental Home: y Eye exams: will schedule one this year  Dermatology home:y  Female Health:  PAP Smear - last known results: 08/19/2014- negative STD concerns: none, monogamous Lumps or breast concerns:  none Breast Cancer Family History: Y, maternal aunt Bone/ DEXA scan:   N/A  Additional concerns beyond health maintenance issues:  none   Immunization History  Administered Date(s) Administered  . PFIZER SARS-COV-2 Vaccination 06/03/2019, 06/28/2019  . Tdap 02/18/2008, 09/09/2018     Health Maintenance  Topic Date Due  . HIV Screening  Never done   . PAP SMEAR-Modifier  08/18/2017  . COLONOSCOPY  12/09/2018  . MAMMOGRAM  09/06/2019  . Hepatitis C Screening  09/12/2020 (Originally [birth year])  . INFLUENZA VACCINE  10/17/2019  . TETANUS/TDAP  09/08/2028  . COVID-19 Vaccine  Completed     Wt Readings from Last 3 Encounters:  09/13/19 169 lb 9.6 oz (76.9 kg)  05/06/19 163 lb (73.9 kg)  01/25/19 163 lb 4.8 oz (74.1 kg)   BP Readings from Last 3 Encounters:  09/13/19 113/80  05/06/19 109/73  01/25/19 (!) 159/94   Pulse Readings from Last 3 Encounters:  09/13/19 77  01/25/19 66  09/09/18 70     Past Medical History:  Diagnosis Date  . Allergy    to medication  . High cholesterol   . Hypercholesterolemia   . Hyperlipidemia    Phreesia 09/10/2019  . Hypertension   . Mixed hyperlipidemia   . Postmenopausal   . Pure hypercholesterolemia   . Thyroid nodule   . Tobacco abuse       Past Surgical History:  Procedure Laterality Date  . CESAREAN SECTION        Family History  Problem Relation Age of Onset  . Hyperlipidemia Mother   . Hypertension Mother   . Stroke Mother   . Hypertension Father   . Hypertension Sister   . Breast cancer Maternal Aunt   . Lung cancer Paternal Uncle       Social History   Substance and Sexual Activity  Drug Use No  ,   Social History   Substance and Sexual Activity  Alcohol Use Yes   Comment: rarely  ,   Social History   Tobacco Use  Smoking Status Current Every Day Smoker  . Packs/day: 1.00  . Types: Cigarettes  Smokeless Tobacco Never Used  Tobacco Comment   Patient has not tried Chantix or Equip  ,   Social History   Substance and Sexual Activity  Sexual Activity Yes  . Partners: Male  . Birth control/protection: None    Current Outpatient Medications on File Prior to Visit  Medication Sig Dispense Refill  . amlodipine-benazepril (LOTREL) 2.5-10 MG capsule Take 1 capsule by mouth daily. 90 capsule 0  . Cholecalciferol (VITAMIN D3) 5000 units  CAPS Take 1 capsule by mouth daily.    . Coenzyme Q10 (COQ10 PO) Take 1 capsule by mouth daily.    . Omega 3 1200 MG CAPS Take 1 capsule by mouth daily.    . rosuvastatin (CRESTOR) 10 MG tablet Take 1 tablet (10 mg total) by mouth at bedtime. 90 tablet 0   No current facility-administered medications on file prior to visit.    Allergies: Other and Paxil [paroxetine]  Review of Systems: General:   Denies fever, chills, unexplained weight loss.  Optho/Auditory:   Denies visual changes, blurred vision/LOV Respiratory:   Denies SOB, DOE more than baseline levels.   Cardiovascular:   Denies chest pain, palpitations, new onset peripheral edema  Gastrointestinal:   Denies nausea, vomiting, diarrhea.  Genitourinary: Denies dysuria, freq/ urgency, flank pain or discharge from genitals.  Endocrine:     Denies hot or cold intolerance,  polyuria, polydipsia. Musculoskeletal:   Denies unexplained myalgias, joint swelling, unexplained arthralgias, gait problems.  Skin:  Denies rash, suspicious lesions Neurological:     Denies dizziness, unexplained weakness, numbness  Psychiatric/Behavioral:   Denies mood changes, suicidal or homicidal ideations, hallucinations    Objective:    Blood pressure 113/80, pulse 77, temperature 98.5 F (36.9 C), temperature source Oral, height 5' 9.75" (1.772 m), weight 169 lb 9.6 oz (76.9 kg), SpO2 97 %. Body mass index is 24.51 kg/m. General Appearance:    Alert, cooperative, no distress, appears stated age  Head:    Normocephalic, without obvious abnormality, atraumatic  Eyes:    PERRL, conjunctiva/corneas clear, EOM's intact, fundi    benign, both eyes  Ears:    Normal TM's and external ear canals, both ears  Nose:   Nares normal, septum midline, mucosa normal, no drainage    or sinus tenderness  Throat:   Lips w/o lesion, mucosa moist, and tongue normal; teeth and   gums normal  Neck:   Supple, symmetrical, trachea midline, no adenopathy;    thyroid: no  enlargement/tenderness/nodules; no carotid   bruit or JVD  Back:     Symmetric, no curvature, ROM normal, no CVA tenderness  Lungs:     Clear to auscultation bilaterally, respirations unlabored, no Wh/ R/ R  Chest Wall:    No tenderness or gross deformity; normal excursion   Heart:    Regular rate and rhythm, S1 and S2 normal, no murmur, rub   or gallop  Breast Exam:    Deferred.  Abdomen:     Soft, non-tender, bowel sounds active all four quadrants, No  G/R/R, no masses, no organomegaly  Genitalia:    Ext genitalia: dryness noted, without lesion, no discharge, erythematous, no tenderness;  Cervix: WNL's w/o discharge or lesion; Adnexa: No tenderness or palpable masses; Chaperone present  Rectal:    Deferred.  Extremities:   Extremities normal, atraumatic, no cyanosis or gross edema  Pulses:   Good pulses  Skin:   Warm, dry, Skin color, texture, turgor normal, no obvious rashes or lesions Psych: No HI/SI, judgement and insight good, Euthymic mood. Full Affect.  Neurologic:   CNII-XII grossly intact, normal strength, sensation and reflexes througout

## 2019-09-13 NOTE — Patient Instructions (Signed)

## 2019-09-14 ENCOUNTER — Encounter: Payer: Self-pay | Admitting: Family Medicine

## 2019-09-14 ENCOUNTER — Ambulatory Visit: Payer: BC Managed Care – PPO | Admitting: Family Medicine

## 2019-09-14 VITALS — BP 130/90 | HR 79 | Temp 98.2°F | Ht 70.0 in | Wt 165.5 lb

## 2019-09-14 DIAGNOSIS — I10 Essential (primary) hypertension: Secondary | ICD-10-CM

## 2019-09-14 DIAGNOSIS — E785 Hyperlipidemia, unspecified: Secondary | ICD-10-CM

## 2019-09-14 DIAGNOSIS — F4323 Adjustment disorder with mixed anxiety and depressed mood: Secondary | ICD-10-CM

## 2019-09-14 DIAGNOSIS — Z720 Tobacco use: Secondary | ICD-10-CM

## 2019-09-14 LAB — CYTOLOGY - PAP
Comment: NEGATIVE
Diagnosis: NEGATIVE
High risk HPV: NEGATIVE

## 2019-09-14 LAB — HEPATITIS C ANTIBODY: Hep C Virus Ab: <0.1 s/co ratio (ref 0.0–0.9)

## 2019-09-14 NOTE — Progress Notes (Signed)
Subjective:     Savannah Mendez is a 64 y.o. female presenting for Establish Care     HPI  #tobacco use - has been trying to quit - has had a lot of family health issues and loss - not ready at this point to quit - tried nicotine gum replacement - feels like anxiety does contribute    #depression - more tearful - feels like she easily cries - turns to god and tries to handle things on her own - tried celexa in the past  - worried about weight gain  #HTN - taking medication - 113/80 yesterday - does not regularly check at home  Review of Systems   Social History   Tobacco Use  Smoking Status Current Every Day Smoker  . Packs/day: 1.00  . Years: 35.00  . Pack years: 35.00  . Types: Cigarettes  Smokeless Tobacco Never Used  Tobacco Comment   Patient has not tried Chantix or Equip        Objective:    BP Readings from Last 3 Encounters:  09/14/19 130/90  09/13/19 113/80  05/06/19 109/73   Wt Readings from Last 3 Encounters:  09/14/19 165 lb 8 oz (75.1 kg)  09/13/19 169 lb 9.6 oz (76.9 kg)  05/06/19 163 lb (73.9 kg)    BP 130/90   Pulse 79   Temp 98.2 F (36.8 C) (Temporal)   Ht 5\' 10"  (1.778 m)   Wt 165 lb 8 oz (75.1 kg)   SpO2 98%   BMI 23.75 kg/m    Physical Exam Constitutional:      General: She is not in acute distress.    Appearance: She is well-developed. She is not diaphoretic.  HENT:     Right Ear: External ear normal.     Left Ear: External ear normal.     Nose: Nose normal.  Eyes:     Conjunctiva/sclera: Conjunctivae normal.  Cardiovascular:     Rate and Rhythm: Normal rate and regular rhythm.     Heart sounds: No murmur heard.   Pulmonary:     Effort: Pulmonary effort is normal. No respiratory distress.     Breath sounds: Normal breath sounds. No wheezing.  Musculoskeletal:     Cervical back: Neck supple.  Skin:    General: Skin is warm and dry.     Capillary Refill: Capillary refill takes less than 2 seconds.    Neurological:     Mental Status: She is alert. Mental status is at baseline.  Psychiatric:        Mood and Affect: Mood normal.        Behavior: Behavior normal.      Office Visit from 09/13/2019 in [place] at [facility]  PHQ-9 Total Score 7            Assessment & Plan:   Problem List Items Addressed This Visit      Cardiovascular and Mediastinum   Hypertension - Primary (Chronic)    BP slightly elevated, but controlled at OV yesterday. Cont current medications. Labs reviewed and stable.         Other   HLD (hyperlipidemia) (Chronic)    LDL is high, but she cannot tolerate higher doses. Encouraged diet help      Adjustment disorder with mixed anxiety and depressed mood (Chronic)    Mother passed recently and sister was diagnosed with AML. Encouraged mindfulness and number for therapy provided. Discussed wellbutrin may be a good option if  she is interested she will return      Tobacco abuse (Chronic)    Pt not currently ready to quit. Discussed that she is using tobacco to treat anxiety/mood concerns as a way to relax. Discussed that wellbutrin may be a good option as it may help with some mild depression symptoms. Information provided, she will consider.           Return in about 6 months (around 03/15/2020).  Lesleigh Noe, MD  This visit occurred during the SARS-CoV-2 public health emergency.  Safety protocols were in place, including screening questions prior to the visit, additional usage of staff PPE, and extensive cleaning of exam room while observing appropriate contact time as indicated for disinfecting solutions.

## 2019-09-14 NOTE — Assessment & Plan Note (Signed)
Pt not currently ready to quit. Discussed that she is using tobacco to treat anxiety/mood concerns as a way to relax. Discussed that wellbutrin may be a good option as it may help with some mild depression symptoms. Information provided, she will consider.

## 2019-09-14 NOTE — Assessment & Plan Note (Signed)
LDL is high, but she cannot tolerate higher doses. Encouraged diet help

## 2019-09-14 NOTE — Patient Instructions (Signed)
How to help anxiety - without medication.   1) Regular Exercise - walking, jogging, cycling, dancing, strength training --> Yoga has been shown in research to reduce depression and anxiety -- with even just one hour long session per week  2)  Begin a Mindfulness/Meditation practice -- this can take a little as 3 minutes and is helpful for all kinds of mood issues -- You can find resources in books -- Or you can download apps like  ---- Headspace App (which currently has free content called "Weathering the Storm") ---- Calm (which has a few free options)  ---- Insignt Timer ---- Stop, Breathe & Think  # With each of these Apps - you should decline the "start free trial" offer and as you search through the App should be able to access some of their free content. You can also chose to pay for the content if you find one that works well for you.   # Many of them also offer sleep specific content which may help with insomnia  3) Healthy Diet -- Avoid or decrease Caffeine -- Avoid or decrease Alcohol -- Drink plenty of water, have a balanced diet -- Avoid cigarettes and marijuana (as well as other recreational drugs)  4) Consider contacting a professional therapist  -- [facility] is one option. Call [phone] -- Or you can check out www.psychologytoday.com -- you can read bios of therapists and see if they accept insurance -- Check with your insurance to see if you have coverage and who may take your insurance        Bupropion extended-release tablets (Depression/Mood Disorders) What is this medicine? BUPROPION (byoo PROE pee on) is used to treat depression. This medicine may be used for other purposes; ask your health care provider or pharmacist if you have questions. COMMON BRAND NAME(S): Aplenzin, Budeprion XL, Forfivo XL, Wellbutrin XL  How should I use this medicine? Take this medicine by mouth with a glass of water. Follow the directions on the prescription  label. You can take it with or without food. If it upsets your stomach, take it with food. Do not crush, chew, or cut these tablets. This medicine is taken once daily at the same time each day. Do not take your medicine more often than directed. Do not stop taking this medicine suddenly except upon the advice of your doctor. Stopping this medicine too quickly may cause serious side effects or your condition may worsen. A special MedGuide will be given to you by the pharmacist with each prescription and refill. Be sure to read this information carefully each time. Talk to your pediatrician regarding the use of this medicine in children. Special care may be needed. Overdosage: If you think you have taken too much of this medicine contact a poison control center or emergency room at once. NOTE: This medicine is only for you. Do not share this medicine with others. What if I miss a dose? If you miss a dose, skip the missed dose and take your next tablet at the regular time. Do not take double or extra doses. What may interact with this medicine? Do not take this medicine with any of the following medications:  linezolid  MAOIs like Azilect, Carbex, Eldepryl, Marplan, Nardil, and Parnate  methylene blue (injected into a vein)  other medicines that contain bupropion like Zyban This medicine may also interact with the following medications:  alcohol  certain medicines for anxiety or sleep  certain medicines for blood pressure like metoprolol, propranolol  certain medicines for depression or psychotic disturbances  certain medicines for HIV or AIDS like efavirenz, lopinavir, nelfinavir, ritonavir  certain medicines for irregular heart beat like propafenone, flecainide  certain medicines for Parkinson's disease like amantadine, levodopa  certain medicines for seizures like carbamazepine, phenytoin,  phenobarbital  cimetidine  clopidogrel  cyclophosphamide  digoxin  furazolidone  isoniazid  nicotine  orphenadrine  procarbazine  steroid medicines like prednisone or cortisone  stimulant medicines for attention disorders, weight loss, or to stay awake  tamoxifen  theophylline  thiotepa  ticlopidine  tramadol  warfarin This list may not describe all possible interactions. Give your health care provider a list of all the medicines, herbs, non-prescription drugs, or dietary supplements you use. Also tell them if you smoke, drink alcohol, or use illegal drugs. Some items may interact with your medicine. What should I watch for while using this medicine? Tell your doctor if your symptoms do not get better or if they get worse. Visit your doctor or healthcare provider for regular checks on your progress. Because it may take several weeks to see the full effects of this medicine, it is important to continue your treatment as prescribed by your doctor. This medicine may cause serious skin reactions. They can happen weeks to months after starting the medicine. Contact your healthcare provider right away if you notice fevers or flu-like symptoms with a rash. The rash may be red or purple and then turn into blisters or peeling of the skin. Or, you might notice a red rash with swelling of the face, lips or lymph nodes in your neck or under your arms. Patients and their families should watch out for new or worsening thoughts of suicide or depression. Also watch out for sudden changes in feelings such as feeling anxious, agitated, panicky, irritable, hostile, aggressive, impulsive, severely restless, overly excited and hyperactive, or not being able to sleep. If this happens, especially at the beginning of treatment or after a change in dose, call your healthcare provider. Avoid alcoholic drinks while taking this medicine. Drinking large amounts of alcoholic beverages, using sleeping or  anxiety medicines, or quickly stopping the use of these agents while taking this medicine may increase your risk for a seizure. Do not drive or use heavy machinery until you know how this medicine affects you. This medicine can impair your ability to perform these tasks. Do not take this medicine close to bedtime. It may prevent you from sleeping. Your mouth may get dry. Chewing sugarless gum or sucking hard candy, and drinking plenty of water may help. Contact your doctor if the problem does not go away or is severe. The tablet shell for some brands of this medicine does not dissolve. This is normal. The tablet shell may appear whole in the stool. This is not a cause for concern. What side effects may I notice from receiving this medicine? Side effects that you should report to your doctor or health care professional as soon as possible:  allergic reactions like skin rash, itching or hives, swelling of the face, lips, or tongue  breathing problems  changes in vision  confusion  elevated mood, decreased need for sleep, racing thoughts, impulsive behavior  fast or irregular heartbeat  hallucinations, loss of contact with reality  increased blood pressure  rash, fever, and swollen lymph nodes  redness, blistering, peeling or loosening of the skin, including inside the mouth  seizures  suicidal thoughts or other mood changes  unusually weak or tired  vomiting Side effects  that usually do not require medical attention (report to your doctor or health care professional if they continue or are bothersome):  constipation  headache  loss of appetite  nausea  tremors  weight loss This list may not describe all possible side effects. Call your doctor for medical advice about side effects. You may report side effects to FDA at 1-800-FDA-1088. Where should I keep my medicine? Keep out of the reach of children. Store at room temperature between 15 and 30 degrees C (59 and 86  degrees F). Throw away any unused medicine after the expiration date. NOTE: This sheet is a summary. It may not cover all possible information. If you have questions about this medicine, talk to your doctor, pharmacist, or health care provider.  2020 Elsevier/Gold Standard (2018-05-28 13:45:31)

## 2019-09-14 NOTE — Assessment & Plan Note (Signed)
BP slightly elevated, but controlled at OV yesterday. Cont current medications. Labs reviewed and stable.

## 2019-09-14 NOTE — Assessment & Plan Note (Signed)
Mother passed recently and sister was diagnosed with AML. Encouraged mindfulness and number for therapy provided. Discussed wellbutrin may be a good option if she is interested she will return

## 2019-09-16 NOTE — Progress Notes (Signed)
Opened in error. T. Clotee Schlicker, CMA 

## 2019-09-28 ENCOUNTER — Other Ambulatory Visit: Payer: Self-pay | Admitting: Physician Assistant

## 2019-09-29 ENCOUNTER — Telehealth: Payer: Self-pay | Admitting: Family Medicine

## 2019-09-29 ENCOUNTER — Other Ambulatory Visit: Payer: Self-pay | Admitting: Physician Assistant

## 2019-09-29 NOTE — Telephone Encounter (Signed)
Patient called and moved up her CT Lung Screen for tomorrow and [facility] called saying her [facility] needs a PA for this study. Can we try to get this PA by chance today on short notice?

## 2019-09-29 NOTE — Telephone Encounter (Signed)
Advised pt that since she is no longer a patient of our practice, she will need to have her new PCP order the imaging and obtain PA.  Pt expressed understanding and is agreeable.  Charyl Bigger, CMA

## 2019-09-30 ENCOUNTER — Ambulatory Visit: Payer: BC Managed Care – PPO

## 2019-10-06 ENCOUNTER — Encounter: Payer: Self-pay | Admitting: Family Medicine

## 2019-10-19 ENCOUNTER — Encounter: Payer: Self-pay | Admitting: Family Medicine

## 2019-10-19 DIAGNOSIS — R921 Mammographic calcification found on diagnostic imaging of breast: Secondary | ICD-10-CM | POA: Diagnosis not present

## 2019-10-19 DIAGNOSIS — N6489 Other specified disorders of breast: Secondary | ICD-10-CM | POA: Diagnosis not present

## 2019-10-26 ENCOUNTER — Other Ambulatory Visit: Payer: Self-pay | Admitting: Radiology

## 2019-10-26 ENCOUNTER — Encounter: Payer: Self-pay | Admitting: Family Medicine

## 2019-10-26 DIAGNOSIS — N6022 Fibroadenosis of left breast: Secondary | ICD-10-CM | POA: Diagnosis not present

## 2019-10-26 DIAGNOSIS — R928 Other abnormal and inconclusive findings on diagnostic imaging of breast: Secondary | ICD-10-CM | POA: Diagnosis not present

## 2019-10-26 DIAGNOSIS — R921 Mammographic calcification found on diagnostic imaging of breast: Secondary | ICD-10-CM | POA: Diagnosis not present

## 2019-11-17 DIAGNOSIS — D485 Neoplasm of uncertain behavior of skin: Secondary | ICD-10-CM | POA: Diagnosis not present

## 2019-11-17 DIAGNOSIS — C44519 Basal cell carcinoma of skin of other part of trunk: Secondary | ICD-10-CM | POA: Diagnosis not present

## 2019-11-17 DIAGNOSIS — D1801 Hemangioma of skin and subcutaneous tissue: Secondary | ICD-10-CM | POA: Diagnosis not present

## 2019-11-17 DIAGNOSIS — L570 Actinic keratosis: Secondary | ICD-10-CM | POA: Diagnosis not present

## 2019-11-17 DIAGNOSIS — D225 Melanocytic nevi of trunk: Secondary | ICD-10-CM | POA: Diagnosis not present

## 2019-11-17 DIAGNOSIS — L821 Other seborrheic keratosis: Secondary | ICD-10-CM | POA: Diagnosis not present

## 2019-11-17 DIAGNOSIS — C44311 Basal cell carcinoma of skin of nose: Secondary | ICD-10-CM | POA: Diagnosis not present

## 2019-11-17 DIAGNOSIS — L720 Epidermal cyst: Secondary | ICD-10-CM | POA: Diagnosis not present

## 2019-11-19 ENCOUNTER — Other Ambulatory Visit: Payer: Self-pay | Admitting: Family Medicine

## 2019-12-30 DIAGNOSIS — C44311 Basal cell carcinoma of skin of nose: Secondary | ICD-10-CM | POA: Diagnosis not present

## 2020-01-12 DIAGNOSIS — C44519 Basal cell carcinoma of skin of other part of trunk: Secondary | ICD-10-CM | POA: Diagnosis not present

## 2020-03-01 ENCOUNTER — Other Ambulatory Visit: Payer: Self-pay | Admitting: Family Medicine

## 2020-03-02 NOTE — Telephone Encounter (Signed)
Sending in 30 day refill. Pt needs 6 month follow-up for hypertension, please

## 2020-03-03 NOTE — Telephone Encounter (Signed)
Called patient and LVM to call back and schedule.

## 2020-03-07 NOTE — Telephone Encounter (Signed)
Viewed and scheduled with PCP

## 2020-03-15 ENCOUNTER — Ambulatory Visit (INDEPENDENT_AMBULATORY_CARE_PROVIDER_SITE_OTHER): Payer: Medicare Other | Admitting: Family Medicine

## 2020-03-15 ENCOUNTER — Other Ambulatory Visit: Payer: Self-pay

## 2020-03-15 VITALS — BP 138/80 | HR 75 | Temp 98.3°F | Ht 70.5 in | Wt 170.1 lb

## 2020-03-15 DIAGNOSIS — I10 Essential (primary) hypertension: Secondary | ICD-10-CM

## 2020-03-15 DIAGNOSIS — E785 Hyperlipidemia, unspecified: Secondary | ICD-10-CM | POA: Diagnosis not present

## 2020-03-15 DIAGNOSIS — M25561 Pain in right knee: Secondary | ICD-10-CM | POA: Diagnosis not present

## 2020-03-15 DIAGNOSIS — G8929 Other chronic pain: Secondary | ICD-10-CM

## 2020-03-15 DIAGNOSIS — Z23 Encounter for immunization: Secondary | ICD-10-CM

## 2020-03-15 DIAGNOSIS — Z720 Tobacco use: Secondary | ICD-10-CM

## 2020-03-15 DIAGNOSIS — Z1211 Encounter for screening for malignant neoplasm of colon: Secondary | ICD-10-CM

## 2020-03-15 NOTE — Assessment & Plan Note (Signed)
BP at goal. Cont amlodipine-benazepril 2.5-10 mg.

## 2020-03-15 NOTE — Assessment & Plan Note (Signed)
Cont crestor 10 mg.

## 2020-03-15 NOTE — Assessment & Plan Note (Signed)
Encouraged cessation. Currently she is not ready to quit but will plan for nicotine replacement when ready

## 2020-03-15 NOTE — Progress Notes (Signed)
Subjective:     Savannah Mendez is a 64 y.o. female presenting for Follow-up (X70mos Hypertension)     HPI   #HTN - does not check bp at home - no cp, dizziness, vision changes - taking medication w/o issues - forgets about 1-2 times a week - did take her medication this morning  #Knee pain - hx of pt for this  - needs to start back on her exercises   #tobacco  - quit prior to surgery on her nose - knows she needs to quit - has gum at home  Mood is better  Review of Systems   Social History   Tobacco Use  Smoking Status Current Every Day Smoker  . Packs/day: 1.00  . Years: 35.00  . Pack years: 35.00  . Types: Cigarettes  Smokeless Tobacco Never Used  Tobacco Comment   Patient has not tried Chantix or Equip        Objective:    BP Readings from Last 3 Encounters:  03/15/20 138/80  09/14/19 130/90  09/13/19 113/80   Wt Readings from Last 3 Encounters:  03/15/20 170 lb 1.9 oz (77.2 kg)  09/14/19 165 lb 8 oz (75.1 kg)  09/13/19 169 lb 9.6 oz (76.9 kg)    BP 138/80 (BP Location: Left Arm, Patient Position: Sitting, Cuff Size: Normal)   Pulse 75   Temp 98.3 F (36.8 C) (Temporal)   Ht 5' 10.5" (1.791 m)   Wt 170 lb 1.9 oz (77.2 kg)   SpO2 95%   BMI 24.06 kg/m    Physical Exam Constitutional:      General: She is not in acute distress.    Appearance: She is well-developed. She is not diaphoretic.  HENT:     Right Ear: External ear normal.     Left Ear: External ear normal.  Eyes:     Conjunctiva/sclera: Conjunctivae normal.  Cardiovascular:     Rate and Rhythm: Normal rate and regular rhythm.     Heart sounds: No murmur heard.   Pulmonary:     Effort: Pulmonary effort is normal. No respiratory distress.     Breath sounds: Normal breath sounds. No wheezing.  Musculoskeletal:     Cervical back: Neck supple.  Skin:    General: Skin is warm and dry.     Capillary Refill: Capillary refill takes less than 2 seconds.  Neurological:      Mental Status: She is alert. Mental status is at baseline.  Psychiatric:        Mood and Affect: Mood normal.        Behavior: Behavior normal.           Assessment & Plan:   Problem List Items Addressed This Visit      Cardiovascular and Mediastinum   Hypertension - Primary (Chronic)    BP at goal. Cont amlodipine-benazepril 2.5-10 mg.         Other   HLD (hyperlipidemia) (Chronic)    Cont crestor 10 mg.       Tobacco abuse (Chronic)    Encouraged cessation. Currently she is not ready to quit but will plan for nicotine replacement when ready      Chronic pain of right knee    Encouraged picking up old PT exercises.        Other Visit Diagnoses    Colon cancer screening       Relevant Orders   Cologuard   Need for vaccination with 13-polyvalent pneumococcal conjugate vaccine  Relevant Orders   Pneumococcal conjugate vaccine 13-valent IM (Completed)       Return in about 6 months (around 09/13/2020) for wellness visit.  Lesleigh Noe, MD  This visit occurred during the SARS-CoV-2 public health emergency.  Safety protocols were in place, including screening questions prior to the visit, additional usage of staff PPE, and extensive cleaning of exam room while observing appropriate contact time as indicated for disinfecting solutions.

## 2020-03-15 NOTE — Patient Instructions (Signed)
Blood pressure looks good!  Continue current medications  Return for annual wellness in 6 months

## 2020-03-15 NOTE — Assessment & Plan Note (Signed)
Encouraged picking up old PT exercises.

## 2020-04-26 DIAGNOSIS — Z1211 Encounter for screening for malignant neoplasm of colon: Secondary | ICD-10-CM | POA: Diagnosis not present

## 2020-04-26 LAB — COLOGUARD: Cologuard: NEGATIVE

## 2020-05-04 LAB — EXTERNAL GENERIC LAB PROCEDURE: COLOGUARD: NEGATIVE

## 2020-05-04 LAB — COLOGUARD: COLOGUARD: NEGATIVE

## 2020-05-11 ENCOUNTER — Telehealth: Payer: Self-pay

## 2020-05-11 ENCOUNTER — Encounter: Payer: Self-pay | Admitting: Family Medicine

## 2020-05-11 NOTE — Telephone Encounter (Signed)
Spoke to pt and notified her of her negative cologuard results.

## 2020-06-20 DIAGNOSIS — R928 Other abnormal and inconclusive findings on diagnostic imaging of breast: Secondary | ICD-10-CM | POA: Diagnosis not present

## 2020-06-20 DIAGNOSIS — R921 Mammographic calcification found on diagnostic imaging of breast: Secondary | ICD-10-CM | POA: Diagnosis not present

## 2020-06-20 DIAGNOSIS — R922 Inconclusive mammogram: Secondary | ICD-10-CM | POA: Diagnosis not present

## 2020-06-20 LAB — HM MAMMOGRAPHY

## 2020-07-05 ENCOUNTER — Encounter: Payer: Self-pay | Admitting: Family Medicine

## 2020-07-11 DIAGNOSIS — D225 Melanocytic nevi of trunk: Secondary | ICD-10-CM | POA: Diagnosis not present

## 2020-07-11 DIAGNOSIS — D485 Neoplasm of uncertain behavior of skin: Secondary | ICD-10-CM | POA: Diagnosis not present

## 2020-07-11 DIAGNOSIS — Z85828 Personal history of other malignant neoplasm of skin: Secondary | ICD-10-CM | POA: Diagnosis not present

## 2020-07-11 DIAGNOSIS — S20461A Insect bite (nonvenomous) of right back wall of thorax, initial encounter: Secondary | ICD-10-CM | POA: Diagnosis not present

## 2020-07-11 DIAGNOSIS — L578 Other skin changes due to chronic exposure to nonionizing radiation: Secondary | ICD-10-CM | POA: Diagnosis not present

## 2020-07-11 DIAGNOSIS — L821 Other seborrheic keratosis: Secondary | ICD-10-CM | POA: Diagnosis not present

## 2020-07-11 DIAGNOSIS — W57XXXA Bitten or stung by nonvenomous insect and other nonvenomous arthropods, initial encounter: Secondary | ICD-10-CM | POA: Diagnosis not present

## 2020-09-20 DIAGNOSIS — H5203 Hypermetropia, bilateral: Secondary | ICD-10-CM | POA: Diagnosis not present

## 2020-09-20 DIAGNOSIS — H40013 Open angle with borderline findings, low risk, bilateral: Secondary | ICD-10-CM | POA: Diagnosis not present

## 2020-09-20 DIAGNOSIS — H524 Presbyopia: Secondary | ICD-10-CM | POA: Diagnosis not present

## 2020-09-20 DIAGNOSIS — H2513 Age-related nuclear cataract, bilateral: Secondary | ICD-10-CM | POA: Diagnosis not present

## 2020-10-03 DIAGNOSIS — Z1231 Encounter for screening mammogram for malignant neoplasm of breast: Secondary | ICD-10-CM | POA: Diagnosis not present

## 2020-12-27 ENCOUNTER — Telehealth: Payer: Self-pay | Admitting: Family Medicine

## 2020-12-28 NOTE — Telephone Encounter (Signed)
(   Female ) answered phone said pt was not home but will get message to pt to call to schedule a AWV

## 2020-12-29 ENCOUNTER — Other Ambulatory Visit: Payer: Self-pay

## 2020-12-29 MED ORDER — AMLODIPINE BESY-BENAZEPRIL HCL 2.5-10 MG PO CAPS: 1.0000 | ORAL_CAPSULE | Freq: Every day | ORAL | 0 refills | Status: DC

## 2020-12-29 NOTE — Telephone Encounter (Signed)
2nd attemp lvm for pt to call to schedule AWV

## 2020-12-29 NOTE — Telephone Encounter (Signed)
30 day refill sent in. Appt has been scheduled.

## 2021-01-09 ENCOUNTER — Other Ambulatory Visit: Payer: Self-pay

## 2021-01-09 ENCOUNTER — Ambulatory Visit (INDEPENDENT_AMBULATORY_CARE_PROVIDER_SITE_OTHER): Payer: Medicare Other | Admitting: Family Medicine

## 2021-01-09 VITALS — BP 122/80 | HR 66 | Temp 97.0°F | Ht 69.5 in | Wt 167.0 lb

## 2021-01-09 DIAGNOSIS — E2839 Other primary ovarian failure: Secondary | ICD-10-CM | POA: Diagnosis not present

## 2021-01-09 DIAGNOSIS — E041 Nontoxic single thyroid nodule: Secondary | ICD-10-CM | POA: Diagnosis not present

## 2021-01-09 DIAGNOSIS — R7303 Prediabetes: Secondary | ICD-10-CM

## 2021-01-09 DIAGNOSIS — I10 Essential (primary) hypertension: Secondary | ICD-10-CM | POA: Diagnosis not present

## 2021-01-09 DIAGNOSIS — Z720 Tobacco use: Secondary | ICD-10-CM | POA: Diagnosis not present

## 2021-01-09 DIAGNOSIS — E785 Hyperlipidemia, unspecified: Secondary | ICD-10-CM | POA: Diagnosis not present

## 2021-01-09 DIAGNOSIS — Z0000 Encounter for general adult medical examination without abnormal findings: Secondary | ICD-10-CM | POA: Diagnosis not present

## 2021-01-09 LAB — LIPID PANEL
Cholesterol: 270 mg/dL — ABNORMAL HIGH (ref 0–200)
HDL: 49.9 mg/dL (ref >39.00)
LDL Cholesterol: 199 mg/dL — ABNORMAL HIGH (ref 0–99)
NonHDL: 220
Total CHOL/HDL Ratio: 5
Triglycerides: 104 mg/dL (ref 0.0–149.0)
VLDL: 20.8 mg/dL (ref 0.0–40.0)

## 2021-01-09 LAB — COMPREHENSIVE METABOLIC PANEL
ALT: 13 U/L (ref 0–35)
AST: 17 U/L (ref 0–37)
Albumin: 4.2 g/dL (ref 3.5–5.2)
Alkaline Phosphatase: 63 U/L (ref 39–117)
BUN: 16 mg/dL (ref 6–23)
CO2: 29 mEq/L (ref 19–32)
Calcium: 9.4 mg/dL (ref 8.4–10.5)
Chloride: 103 mEq/L (ref 96–112)
Creatinine, Ser: 0.76 mg/dL (ref 0.40–1.20)
GFR: 81.77 mL/min (ref >60.00)
Glucose, Bld: 88 mg/dL (ref 70–99)
Potassium: 4.1 mEq/L (ref 3.5–5.1)
Sodium: 137 mEq/L (ref 135–145)
Total Bilirubin: 0.5 mg/dL (ref 0.2–1.2)
Total Protein: 6.9 g/dL (ref 6.0–8.3)

## 2021-01-09 LAB — HEMOGLOBIN A1C: Hgb A1c MFr Bld: 5.7 % (ref 4.6–6.5)

## 2021-01-09 LAB — TSH: TSH: 1.92 u[IU]/mL (ref 0.35–5.50)

## 2021-01-09 NOTE — Patient Instructions (Signed)
#  Referral I have placed a referral to a specialist for you. You should receive a phone call from the specialty office. Make sure your voicemail is not full and that if you are able to answer your phone to unknown or new numbers.   It may take up to 2 weeks to hear about the referral. If you do not hear anything in 2 weeks, please call our office and ask to speak with the referral coordinator.   - lung cancer screening  Schedule Dexa with mammogram next year

## 2021-01-09 NOTE — Assessment & Plan Note (Signed)
Stopped crestor with improvement in knee pain. Will reassess today

## 2021-01-09 NOTE — Progress Notes (Signed)
Subjective:   Savannah Mendez is a 65 y.o. female who presents for an Initial Medicare Annual Wellness Visit.  Review of Systems    Review of Systems  Constitutional:  Negative for chills and fever.  HENT:  Negative for congestion and sore throat.   Eyes:  Negative for blurred vision and double vision.  Respiratory:  Negative for shortness of breath.   Cardiovascular:  Negative for chest pain.  Gastrointestinal:  Negative for heartburn, nausea and vomiting.  Genitourinary: Negative.   Musculoskeletal: Negative.  Negative for myalgias.  Skin:  Negative for rash.  Neurological:  Negative for dizziness and headaches.  Endo/Heme/Allergies:  Does not bruise/bleed easily.  Psychiatric/Behavioral:  Negative for depression. The patient is not nervous/anxious.          Objective:    Today's Vitals   01/09/21 1045  BP: 122/80  Pulse: 66  Temp: (!) 97 F (36.1 C)  TempSrc: Temporal  SpO2: 97%  Weight: 167 lb (75.8 kg)  Height: 5' 9.5" (1.765 m)   Body mass index is 24.31 kg/m.  Advanced Directives 12/17/2017 02/04/2017  Does Patient Have a Medical Advance Directive? Yes Yes  Type of Advance Directive Living will;Healthcare Power of [place];Living will  Copy of [place] in Chart? No - copy requested -    Current Medications (verified) Outpatient Encounter Medications as of 01/09/2021  Medication Sig   amlodipine-benazepril (LOTREL) 2.5-10 MG capsule Take 1 capsule by mouth daily.   Ascorbic Acid (VITAMIN C PO) Take 500 mg by mouth daily at 12 noon.   Cholecalciferol (VITAMIN D3) 5000 units CAPS Take 1 capsule by mouth daily.   Coenzyme Q10 (COQ10 PO) Take 1 capsule by mouth daily.   Omega 3 1200 MG CAPS Take 1 capsule by mouth daily.   [DISCONTINUED] rosuvastatin (CRESTOR) 10 MG tablet Take 1 tablet (10 mg total) by mouth at bedtime.   No facility-administered encounter medications on file as of 01/09/2021.    Allergies  (verified) Other and Paxil [paroxetine]   History: Past Medical History:  Diagnosis Date   Allergy    to medication   Arthritis    knee    Depression    Fainting spell    High cholesterol    Hypercholesterolemia    Hyperlipidemia    Phreesia 09/10/2019   Hypertension    Mixed hyperlipidemia    Postmenopausal    Pure hypercholesterolemia    Thyroid nodule    Tobacco abuse    Urinary tract infection    Past Surgical History:  Procedure Laterality Date   CESAREAN SECTION  1992   Family History  Problem Relation Age of Onset   Hyperlipidemia Mother    Hypertension Mother    Stroke Mother 30   Hypertension Father    Hypertension Sister    Acute myelogenous leukemia Sister    Breast cancer Maternal Aunt    Lung cancer Paternal Uncle    Heart attack Paternal Aunt    Heart attack Paternal Uncle    Social History   Socioeconomic History   Marital status: Married    Spouse name: Louie Casa   Number of children: 2   Years of education: Associates Degree   Highest education level: Not on file  Occupational History   Not on file  Tobacco Use   Smoking status: Every Day    Packs/day: 1.00    Years: 35.00    Pack years: 35.00    Types: Cigarettes  Smokeless tobacco: Never   Tobacco comments:    Patient has not tried Chantix or Equip  Vaping Use   Vaping Use: Never used  Substance and Sexual Activity   Alcohol use: Yes    Comment: rarely   Drug use: No   Sexual activity: Not Currently    Partners: Male    Birth control/protection: Post-menopausal    Comment: husband's health  Other Topics Concern   Not on file  Social History Narrative   09/14/19   From: the area   Living: with husband, Louie Casa (1992)   Work: retired from [employer]       Family: Ewell Poe and [place] - one step grandson      Enjoys: gardening      Exercise: gardening - a few times a week   Diet: more meat than she wants - has a hard time with husband's taste       Safety   Seat belts: Yes    Guns: Yes  and secure   Safe in relationships: Yes    Social Determinants of Health   Financial Resource Strain: Not on file  Food Insecurity: Not on file  Transportation Needs: Not on file  Physical Activity: Not on file  Stress: Not on file  Social Connections: Not on file    Tobacco Counseling Ready to quit: Not Answered Counseling given: Not Answered Tobacco comments: Patient has not tried Chantix or Equip   Clinical Intake:  Pre-visit preparation completed: No  Pain : No/denies pain     BMI - recorded: 24.31 Nutritional Status: BMI of 19-24  Normal Nutritional Risks: None Diabetes: No  How often do you need to have someone help you when you read instructions, pamphlets, or other written materials from your doctor or pharmacy?: 1 - Never What is the last grade level you completed in school?: associate degree  Diabetic? no  Interpreter Needed?: No      Activities of Daily Living No flowsheet data found.  Patient Care Team: Lesleigh Noe, MD as PCP - General (Family Medicine) Jari Pigg, MD as Consulting Physician (Dermatology) Nelwyn Salis[place], [place] as Physician Assistant (Obstetrics and Gynecology) [place], MD as Consulting Physician (Gastroenterology) Sharyne Peach, MD as Consulting Physician (Ophthalmology)  Indicate any recent Medical Services you may have received from other than [facility] providers in the past year (date may be approximate).     Assessment:   This is a routine wellness examination for [place].  Hearing/Vision screen Hearing Screening   250Hz  500Hz  1000Hz  2000Hz  4000Hz   Right ear 20 20 20 20 20   Left ear 20 20 20 20 20   Vision Screening - Comments:: Last eye exam in July 2022 at [facility]   Dietary issues and exercise activities discussed:     Goals Addressed   None   Depression Screen PHQ 2/9 Scores 03/15/2020 09/15/2019 09/13/2019 05/06/2019 01/25/2019 09/09/2018 02/05/2018   PHQ - 2 Score 0 4 4 1 1 1 1   PHQ- 9 Score - 5 7 2 3 5 4     Fall Risk Fall Risk  01/09/2021 03/15/2020 09/13/2019 01/25/2019 09/09/2018  Falls in the past year? 0 0 0 0 0  Number falls in past yr: 0 0 - 0 -  Injury with Fall? - 0 - 0 -  Risk for fall due to : - - No Fall Risks - -  Follow up - Falls evaluation completed Falls evaluation completed Falls evaluation completed Falls evaluation completed  FALL RISK PREVENTION PERTAINING TO THE HOME:  Any stairs in or around the home? Yes  If so, are there any without handrails? Yes  Home free of loose throw rugs in walkways, pet beds, electrical cords, etc? Yes  Adequate lighting in your home to reduce risk of falls? Yes   ASSISTIVE DEVICES UTILIZED TO PREVENT FALLS:  Life alert? No  Use of a cane, walker or w/c? No  Grab bars in the bathroom? No  Shower chair or bench in shower? No  Elevated toilet seat or a handicapped toilet? No   Cognitive Function:      Mini-Cog - 01/09/21 1112     Normal clock drawing test? yes    How many words correct? 3                Immunizations Immunization History  Administered Date(s) Administered   PFIZER(Purple Top)SARS-COV-2 Vaccination 06/03/2019, 06/28/2019   Pneumococcal Conjugate-13 03/15/2020   Tdap 02/18/2008, 09/09/2018    TDAP status: Up to date  Flu Vaccine status: Declined, Education has been provided regarding the importance of this vaccine but patient still declined. Advised may receive this vaccine at local pharmacy or Health Dept. Aware to provide a copy of the vaccination record if obtained from local pharmacy or Health Dept. Verbalized acceptance and understanding.  Pneumococcal vaccine status: Up to date  Covid-19 vaccine status: Completed vaccines  Qualifies for Shingles Vaccine? No   Zostavax completed No   Shingrix Completed?: Yes  Screening Tests Health Maintenance  Topic Date Due   Zoster Vaccines- Shingrix (1 of 2) Never done   COLONOSCOPY (Pts  45-22yrs Insurance coverage will need to be confirmed)  12/09/2018   COVID-19 Vaccine (3 - Pfizer risk series) 07/26/2019   INFLUENZA VACCINE  Never done   Pneumonia Vaccine 41+ Years old (2 - PPSV23 if available, else PCV20) 03/15/2021   MAMMOGRAM  06/21/2022   TETANUS/TDAP  09/08/2028   DEXA SCAN  Completed   Hepatitis C Screening  Completed   HPV VACCINES  Aged Out    Health Maintenance  Health Maintenance Due  Topic Date Due   Zoster Vaccines- Shingrix (1 of 2) Never done   COLONOSCOPY (Pts 45-16yrs Insurance coverage will need to be confirmed)  12/09/2018   COVID-19 Vaccine (3 - Pfizer risk series) 07/26/2019   INFLUENZA VACCINE  Never done   Pneumonia Vaccine 57+ Years old (2 - PPSV23 if available, else PCV20) 03/15/2021    Colorectal cancer screening: Type of screening: Cologuard. Completed 04/2020. Repeat every 3 years  Mammogram status: Completed 06/2020. Repeat every year  Bone Density status: Ordered today. Pt provided with contact info and advised to call to schedule appt.  Lung Cancer Screening: (Low Dose CT Chest recommended if Age 72-80 years, 30 pack-year currently smoking OR have quit w/in 15years.) does qualify.     Lung Cancer Screening Referral: yes  Additional Screening:  Hepatitis C Screening: does qualify; Completed 09/13/2019  Vision Screening: Recommended annual ophthalmology exams for early detection of glaucoma and other disorders of the eye. Is the patient up to date with their annual eye exam?  Yes  Who is the provider or what is the name of the office in which the patient attends annual eye exams? [facility] If pt is not established with a provider, would they like to be referred to a provider to establish care? No .   Dental Screening: Recommended annual dental exams for proper oral hygiene  Community Resource Referral / Chronic Care Management:  CRR required this visit?  No   CCM required this visit?  No      Plan:    Problem List Items  Addressed This Visit       Cardiovascular and Mediastinum   Hypertension (Chronic)   Relevant Orders   Comprehensive metabolic panel     Endocrine   h/o Thyroid nodule- R s/p Bx 2016 or so (Chronic)   Relevant Orders   TSH     Other   HLD (hyperlipidemia) (Chronic)    Stopped crestor with improvement in knee pain. Will reassess today      Relevant Orders   Lipid panel   Tobacco abuse (Chronic)   Relevant Orders   Ambulatory Referral for Lung Cancer Scre   Other Visit Diagnoses     Encounter for Medicare annual wellness exam    -  Primary   Estrogen deficiency       Relevant Orders   DG Bone Density   Prediabetes       Relevant Orders   Hemoglobin A1c        I have personally reviewed and noted the following in the patient's chart:   Medical and social history Use of alcohol, tobacco or illicit drugs  Current medications and supplements including opioid prescriptions. Patient is not currently taking opioid prescriptions. Functional ability and status Nutritional status Physical activity Advanced directives List of other physicians Hospitalizations, surgeries, and ER visits in previous 12 months Vitals Screenings to include cognitive, depression, and falls Referrals and appointments  In addition, I have reviewed and discussed with patient certain preventive protocols, quality metrics, and best practice recommendations. A written personalized care plan for preventive services as well as general preventive health recommendations were provided to patient.     Lesleigh Noe, MD   01/09/2021

## 2021-01-11 ENCOUNTER — Other Ambulatory Visit: Payer: Self-pay | Admitting: Family Medicine

## 2021-01-11 DIAGNOSIS — E785 Hyperlipidemia, unspecified: Secondary | ICD-10-CM

## 2021-01-11 MED ORDER — ROSUVASTATIN CALCIUM 10 MG PO TABS
10.0000 mg | ORAL_TABLET | Freq: Every day | ORAL | 3 refills | Status: DC
Start: 2021-01-11 — End: 2022-01-21

## 2021-01-11 NOTE — Progress Notes (Signed)
The 10-year ASCVD risk score (Arnett DK, et al., 2019) is: 15.8%   Values used to calculate the score:     Age: 65 years     Sex: Female     Is Non-Hispanic African American: No     Diabetic: No     Tobacco smoker: Yes     Systolic Blood Pressure: 742 mmHg     Is BP treated: Yes     HDL Cholesterol: 49.9 mg/dL     Total Cholesterol: 270 mg/dL   Lab Results  Component Value Date   CHOL 270 (H) 01/09/2021   HDL 49.90 01/09/2021   LDLCALC 199 (H) 01/09/2021   TRIG 104.0 01/09/2021   CHOLHDL 5 01/09/2021   Pt willing to restart Crestor

## 2021-01-19 DIAGNOSIS — D225 Melanocytic nevi of trunk: Secondary | ICD-10-CM | POA: Diagnosis not present

## 2021-01-19 DIAGNOSIS — Z85828 Personal history of other malignant neoplasm of skin: Secondary | ICD-10-CM | POA: Diagnosis not present

## 2021-01-19 DIAGNOSIS — L821 Other seborrheic keratosis: Secondary | ICD-10-CM | POA: Diagnosis not present

## 2021-01-19 DIAGNOSIS — L578 Other skin changes due to chronic exposure to nonionizing radiation: Secondary | ICD-10-CM | POA: Diagnosis not present

## 2021-02-21 ENCOUNTER — Other Ambulatory Visit: Payer: Self-pay | Admitting: Family Medicine

## 2021-08-01 DIAGNOSIS — D1722 Benign lipomatous neoplasm of skin and subcutaneous tissue of left arm: Secondary | ICD-10-CM | POA: Diagnosis not present

## 2021-08-01 DIAGNOSIS — L578 Other skin changes due to chronic exposure to nonionizing radiation: Secondary | ICD-10-CM | POA: Diagnosis not present

## 2021-08-01 DIAGNOSIS — D225 Melanocytic nevi of trunk: Secondary | ICD-10-CM | POA: Diagnosis not present

## 2021-08-01 DIAGNOSIS — L821 Other seborrheic keratosis: Secondary | ICD-10-CM | POA: Diagnosis not present

## 2021-10-09 DIAGNOSIS — Z1231 Encounter for screening mammogram for malignant neoplasm of breast: Secondary | ICD-10-CM | POA: Diagnosis not present

## 2021-10-09 LAB — HM MAMMOGRAPHY

## 2021-10-12 ENCOUNTER — Encounter: Payer: Self-pay | Admitting: Family Medicine

## 2022-01-08 ENCOUNTER — Telehealth: Payer: Self-pay | Admitting: Family Medicine

## 2022-01-08 NOTE — Telephone Encounter (Signed)
Left message for patient to call back and schedule Medicare Annual Wellness Visit (AWV) either virtually or phone. Left  my Savannah Mendez number [phone]   Last AWV  01/09/21    45 min for awv-i and in office appointments 30 min for awv-s  phone/virtual appointments

## 2022-01-10 ENCOUNTER — Encounter: Payer: Medicare Other | Admitting: Family Medicine

## 2022-01-14 ENCOUNTER — Ambulatory Visit (INDEPENDENT_AMBULATORY_CARE_PROVIDER_SITE_OTHER): Payer: Medicare Other

## 2022-01-14 VITALS — Ht 70.0 in | Wt 154.0 lb

## 2022-01-14 DIAGNOSIS — Z0000 Encounter for general adult medical examination without abnormal findings: Secondary | ICD-10-CM

## 2022-01-14 NOTE — Progress Notes (Signed)
I connected with [place] today by telephone and verified that I am speaking with the correct person using two identifiers. Location patient: home Location provider: work Persons participating in the virtual visit: [place], Glenna Durand LPN.   I discussed the limitations, risks, security and privacy concerns of performing an evaluation and management service by telephone and the availability of in person appointments. I also discussed with the patient that there may be a patient responsible charge related to this service. The patient expressed understanding and verbally consented to this telephonic visit.    Interactive audio and video telecommunications were attempted between this provider and patient, however failed, due to patient having technical difficulties OR patient did not have access to video capability.  We continued and completed visit with audio only.     Vital signs may be patient reported or missing.  Subjective:   Savannah Mendez is a 66 y.o. female who presents for Medicare Annual (Subsequent) preventive examination.  Review of Systems     Cardiac Risk Factors include: advanced age (>26mn, >>37women);hypertension;smoking/ tobacco exposure     Objective:    Today's Vitals   01/14/22 1011  Weight: 154 lb (69.9 kg)  Height: '5\' 10"'$  (1.778 m)   Body mass index is 22.1 kg/m.     01/14/2022   10:17 AM 01/09/2021   11:08 AM 12/17/2017   11:14 AM 02/04/2017   11:27 AM  Advanced Directives  Does Patient Have a Medical Advance Directive? No No Yes Yes  Type of Advance Directive   Living will;Healthcare Power of [place]  Does patient want to make changes to medical advance directive?  Yes (MAU/Ambulatory/Procedural Areas - Information given)    Copy of [place] Chart?   No - copy requested     Current Medications (verified) Outpatient Encounter Medications as of 01/14/2022  Medication  Sig   amlodipine-benazepril (LOTREL) 2.5-10 MG capsule TAKE 1 CAPSULE BY MOUTH DAILY.   Cholecalciferol (VITAMIN D3) 5000 units CAPS Take 1 capsule by mouth daily.   Coenzyme Q10 (COQ10 PO) Take 1 capsule by mouth daily.   Omega 3 1200 MG CAPS Take 1 capsule by mouth daily.   rosuvastatin (CRESTOR) 10 MG tablet Take 1 tablet (10 mg total) by mouth daily.   Ascorbic Acid (VITAMIN C PO) Take 500 mg by mouth daily at 12 noon. (Patient not taking: Reported on 01/14/2022)   No facility-administered encounter medications on file as of 01/14/2022.    Allergies (verified) Other and Paxil [paroxetine]   History: Past Medical History:  Diagnosis Date   Allergy    to medication   Arthritis    knee    Depression    Fainting spell    High cholesterol    Hypercholesterolemia    Hyperlipidemia    Phreesia 09/10/2019   Hypertension    Mixed hyperlipidemia    Postmenopausal    Pure hypercholesterolemia    Thyroid nodule    Tobacco abuse    Urinary tract infection    Past Surgical History:  Procedure Laterality Date   CESAREAN SECTION  1992   Family History  Problem Relation Age of Onset   Hyperlipidemia Mother    Hypertension Mother    Stroke Mother 89  Hypertension Father    Hypertension Sister    Acute myelogenous leukemia Sister    Breast cancer Maternal Aunt    Lung cancer Paternal Uncle    Heart attack Paternal Aunt  Heart attack Paternal Uncle    Social History   Socioeconomic History   Marital status: Married    Spouse name: Louie Casa   Number of children: 2   Years of education: Associates Degree   Highest education level: Not on file  Occupational History   Not on file  Tobacco Use   Smoking status: Every Day    Packs/day: 1.00    Years: 35.00    Total pack years: 35.00    Types: Cigarettes   Smokeless tobacco: Never   Tobacco comments:    Patient has not tried Chantix or Equip  Vaping Use   Vaping Use: Never used  Substance and Sexual Activity    Alcohol use: Yes    Comment: rarely   Drug use: No   Sexual activity: Not Currently    Partners: Male    Birth control/protection: Post-menopausal    Comment: husband's health  Other Topics Concern   Not on file  Social History Narrative   09/14/19   From: the area   Living: with husband, Louie Casa (1992)   Work: retired from Community education [employer]       Family: Ewell Poe and [place] - one step grandson      Enjoys: gardening      Exercise: gardening - a few times a week   Diet: more meat than she wants - has a hard time with husband's taste      Safety   Seat belts: Yes    Guns: Yes  and secure   Safe in relationships: Yes    Social Determinants of Health   Financial Resource Strain: Low Risk  (01/14/2022)   Overall Financial Resource Strain (CARDIA)    Difficulty of Paying Living Expenses: Not hard at all  Food Insecurity: No Food Insecurity (01/14/2022)   Hunger Vital Sign    Worried About Running Out of Food in the Last Year: Never true    Bandana in the Last Year: Never true  Transportation Needs: No Transportation Needs (01/14/2022)   PRAPARE - Hydrologist (Medical): No    Lack of Transportation (Non-Medical): No  Physical Activity: Inactive (01/14/2022)   Exercise Vital Sign    Days of Exercise per Week: 0 days    Minutes of Exercise per Session: 0 min  Stress: No Stress Concern Present (01/14/2022)   [place]    Feeling of Stress : Not at all  Social Connections: Not on file    Tobacco Counseling Ready to quit: Not Answered Counseling given: Not Answered Tobacco comments: Patient has not tried Chantix or Equip   Clinical Intake:  Pre-visit preparation completed: Yes  Pain : No/denies pain     Nutritional Status: BMI of 19-24  Normal Nutritional Risks: None Diabetes: No  How often do you need to have someone help you when you read  instructions, pamphlets, or other written materials from your doctor or pharmacy?: 1 - Never What is the last grade level you completed in school?: associates degree  Diabetic? no  Interpreter Needed?: No  Information entered by :: NAllen LPN   Activities of Daily Living    01/14/2022   10:18 AM  In your present state of health, do you have any difficulty performing the following activities:  Hearing? 0  Vision? 0  Difficulty concentrating or making decisions? 1  Comment some trouble with decisions  Walking or climbing stairs? 0  Dressing or bathing? 0  Doing errands, shopping? 0  Preparing Food and eating ? N  Using the Toilet? N  In the past six months, have you accidently leaked urine? N  Do you have problems with loss of bowel control? N  Managing your Medications? N  Managing your Finances? N  Housekeeping or managing your Housekeeping? N    Patient Care Team: Waunita Schooner, MD as PCP - General (Family Medicine) Jari Pigg, MD as Consulting Physician (Dermatology) Sharyne Peach, MD as Consulting Physician (Ophthalmology)  Indicate any recent Medical Services you may have received from other than [facility] providers in the past year (date may be approximate).     Assessment:   This is a routine wellness examination for [place].  Hearing/Vision screen Vision Screening - Comments:: No regular eye exams, [facility]  Dietary issues and exercise activities discussed: Current Exercise Habits: The patient does not participate in regular exercise at present   Goals Addressed             This Visit's Progress    Patient Stated       01/14/2022, eventually stop smoking       Depression Screen    01/14/2022   10:18 AM 01/09/2021   11:47 AM 01/09/2021   11:09 AM 03/15/2020   11:46 AM 09/15/2019   10:07 AM 09/13/2019   10:51 AM 05/06/2019    8:09 AM  PHQ 2/9 Scores  PHQ - 2 Score 0 0 0 0 '4 4 1  '$ PHQ- 9 Score     '5 7 2    '$ Fall Risk    01/14/2022    10:18 AM 01/09/2021   10:44 AM 03/15/2020   11:46 AM 09/13/2019   10:51 AM 01/25/2019    9:00 AM  Fall Risk   Falls in the past year? 0 0 0 0 0  Number falls in past yr: 0 0 0  0  Injury with Fall? 0  0  0  Risk for fall due to : Medication side effect   No Fall Risks   Follow up Falls prevention discussed;Education provided;Falls evaluation completed  Falls evaluation completed Falls evaluation completed Falls evaluation completed    FALL RISK PREVENTION PERTAINING TO THE HOME:  Any stairs in or around the home? Yes  If so, are there any without handrails? No  Home free of loose throw rugs in walkways, pet beds, electrical cords, etc? Yes  Adequate lighting in your home to reduce risk of falls? Yes   ASSISTIVE DEVICES UTILIZED TO PREVENT FALLS:  Life alert? No  Use of a cane, walker or w/c? No  Grab bars in the bathroom? No  Shower chair or bench in shower? No  Elevated toilet seat or a handicapped toilet? Yes   TIMED UP AND GO:  Was the test performed? No .      Cognitive Function:        01/14/2022   10:21 AM  6CIT Screen  What Year? 0 points  What month? 0 points  What time? 0 points  Count back from 20 0 points  Months in reverse 0 points  Repeat phrase 0 points  Total Score 0 points    Immunizations Immunization History  Administered Date(s) Administered   PFIZER(Purple Top)SARS-COV-2 Vaccination 06/03/2019, 06/28/2019   Pneumococcal Conjugate-13 03/15/2020   Tdap 02/18/2008, 09/09/2018    TDAP status: Up to date  Flu Vaccine status: Declined, Education has been provided regarding the importance of this vaccine but patient still  declined. Advised may receive this vaccine at local pharmacy or Health Dept. Aware to provide a copy of the vaccination record if obtained from local pharmacy or Health Dept. Verbalized acceptance and understanding.  Pneumococcal vaccine status: Declined,  Education has been provided regarding the importance of this vaccine  but patient still declined. Advised may receive this vaccine at local pharmacy or Health Dept. Aware to provide a copy of the vaccination record if obtained from local pharmacy or Health Dept. Verbalized acceptance and understanding.   Covid-19 vaccine status: Completed vaccines  Qualifies for Shingles Vaccine? Yes   Zostavax completed No   Shingrix Completed?: No.    Education has been provided regarding the importance of this vaccine. Patient has been advised to call insurance company to determine out of pocket expense if they have not yet received this vaccine. Advised may also receive vaccine at local pharmacy or Health Dept. Verbalized acceptance and understanding.  Screening Tests Health Maintenance  Topic Date Due   Zoster Vaccines- Shingrix (1 of 2) Never done   Lung Cancer Screening  Never done   COLONOSCOPY (Pts 45-80yr Insurance coverage will need to be confirmed)  12/09/2018   COVID-19 Vaccine (3 - Pfizer risk series) 07/26/2019   Pneumonia Vaccine 67 Years old (2 - PPSV23 or PCV20) 05/10/2020   INFLUENZA VACCINE  Never done   Medicare Annual Wellness (AWV)  01/09/2022   MAMMOGRAM  10/10/2023   TETANUS/TDAP  09/08/2028   DEXA SCAN  Completed   Hepatitis C Screening  Completed   HPV VACCINES  Aged Out    Health Maintenance  Health Maintenance Due  Topic Date Due   Zoster Vaccines- Shingrix (1 of 2) Never done   Lung Cancer Screening  Never done   COLONOSCOPY (Pts 45-428yrInsurance coverage will need to be confirmed)  12/09/2018   COVID-19 Vaccine (3 - Pfizer risk series) 07/26/2019   Pneumonia Vaccine 6544Years old (2 - PPSV23 or PCV20) 05/10/2020   INFLUENZA VACCINE  Never done   Medicare Annual Wellness (AWV)  01/09/2022    Colorectal cancer screening: Type of screening: Cologuard. Completed 04/26/2020. Repeat every 3 years  Mammogram status: Completed 10/09/2021. Repeat every year  Bone Density status: Completed 10/16/2011.   Lung Cancer Screening: (Low Dose  CT Chest recommended if Age 67-80ears, 30 pack-year currently smoking OR have quit w/in 15years.) does qualify.   Lung Cancer Screening Referral:   Additional Screening:  Hepatitis C Screening: does qualify; Completed 09/13/2019  Vision Screening: Recommended annual ophthalmology exams for early detection of glaucoma and other disorders of the eye. Is the patient up to date with their annual eye exam?  No  Who is the provider or what is the name of the office in which the patient attends annual eye exams? [facility], would they like to be referred to a provider to establish care? No .   Dental Screening: Recommended annual dental exams for proper oral hygiene  Community Resource Referral / Chronic Care Management: CRR required this visit?  No   CCM required this visit?  No      Plan:     I have personally reviewed and noted the following in the patient's chart:   Medical and social history Use of alcohol, tobacco or illicit drugs  Current medications and supplements including opioid prescriptions. Patient is not currently taking opioid prescriptions. Functional ability and status Nutritional status Physical activity Advanced directives List of other physicians Hospitalizations,  surgeries, and ER visits in previous 12 months Vitals Screenings to include cognitive, depression, and falls Referrals and appointments  In addition, I have reviewed and discussed with patient certain preventive protocols, quality metrics, and best practice recommendations. A written personalized care plan for preventive services as well as general preventive health recommendations were provided to patient.     Kellie Simmering, LPN   [id number]   Nurse Notes: none  Due to this being a virtual visit, the after visit summary with patients personalized plan was offered to patient via mail or my-chart.  to pick up at office at next visit

## 2022-01-14 NOTE — Patient Instructions (Signed)
Savannah Mendez , Thank you for taking time to come for your Medicare Wellness Visit. I appreciate your ongoing commitment to your health goals. Please review the following plan we discussed and let me know if I can assist you in the future.   Screening recommendations/referrals: Colonoscopy: cologuard 04/26/2020, due 04/27/2023 Mammogram: completed 10/09/2021, due 10/11/2022 Bone Density: completed 10/16/2011 Recommended yearly ophthalmology/optometry visit for glaucoma screening and checkup Recommended yearly dental visit for hygiene and checkup  Vaccinations: Influenza vaccine: decline Pneumococcal vaccine: decline Tdap vaccine: completed 09/09/2018, due 09/08/2028 Shingles vaccine: discussed   Covid-19: 06/28/2019, 06/03/2019  Advanced directives: Advance directive discussed with you today.   Conditions/risks identified: smoking  Next appointment: Follow up in one year for your annual wellness visit    Preventive Care 67 Years and Older, Female Preventive care refers to lifestyle choices and visits with your health care provider that can promote health and wellness. What does preventive care include? A yearly physical exam. This is also called an annual well check. Dental exams once or twice a year. Routine eye exams. Ask your health care provider how often you should have your eyes checked. Personal lifestyle choices, including: Daily care of your teeth and gums. Regular physical activity. Eating a healthy diet. Avoiding tobacco and drug use. Limiting alcohol use. Practicing safe sex. Taking low-dose aspirin every day. Taking vitamin and mineral supplements as recommended by your health care provider. What happens during an annual well check? The services and screenings done by your health care provider during your annual well check will depend on your age, overall health, lifestyle risk factors, and family history of disease. Counseling  Your health care provider may ask you  questions about your: Alcohol use. Tobacco use. Drug use. Emotional well-being. Home and relationship well-being. Sexual activity. Eating habits. History of falls. Memory and ability to understand (cognition). Work and work Statistician. Reproductive health. Screening  You may have the following tests or measurements: Height, weight, and BMI. Blood pressure. Lipid and cholesterol levels. These may be checked every 5 years, or more frequently if you are over 40 years old. Skin check. Lung cancer screening. You may have this screening every year starting at age 77 if you have a 30-pack-year history of smoking and currently smoke or have quit within the past 15 years. Fecal occult blood test (FOBT) of the stool. You may have this test every year starting at age 57. Flexible sigmoidoscopy or colonoscopy. You may have a sigmoidoscopy every 5 years or a colonoscopy every 10 years starting at age 30. Hepatitis C blood test. Hepatitis B blood test. Sexually transmitted disease (STD) testing. Diabetes screening. This is done by checking your blood sugar (glucose) after you have not eaten for a while (fasting). You may have this done every 1-3 years. Bone density scan. This is done to screen for osteoporosis. You may have this done starting at age 76. Mammogram. This may be done every 1-2 years. Talk to your health care provider about how often you should have regular mammograms. Talk with your health care provider about your test results, treatment options, and if necessary, the need for more tests. Vaccines  Your health care provider may recommend certain vaccines, such as: Influenza vaccine. This is recommended every year. Tetanus, diphtheria, and acellular pertussis (Tdap, Td) vaccine. You may need a Td booster every 10 years. Zoster vaccine. You may need this after age 42. Pneumococcal 13-valent conjugate (PCV13) vaccine. One dose is recommended after age 82. Pneumococcal polysaccharide  (PPSV23) vaccine.  One dose is recommended after age 43. Talk to your health care provider about which screenings and vaccines you need and how often you need them. This information is not intended to replace advice given to you by your health care provider. Make sure you discuss any questions you have with your health care provider. Document Released: 03/31/2015 Document Revised: 11/22/2015 Document Reviewed: 01/03/2015 Elsevier Interactive Patient Education  2017 [place] Prevention in the Home Falls can cause injuries. They can happen to people of all ages. There are many things you can do to make your home safe and to help prevent falls. What can I do on the outside of my home? Regularly fix the edges of walkways and driveways and fix any cracks. Remove anything that might make you trip as you walk through a door, such as a raised step or threshold. Trim any bushes or trees on the path to your home. Use bright outdoor lighting. Clear any walking paths of anything that might make someone trip, such as rocks or tools. Regularly check to see if handrails are loose or broken. Make sure that both sides of any steps have handrails. Any raised decks and porches should have guardrails on the edges. Have any leaves, snow, or ice cleared regularly. Use sand or salt on walking paths during winter. Clean up any spills in your garage right away. This includes oil or grease spills. What can I do in the bathroom? Use night lights. Install grab bars by the toilet and in the tub and shower. Do not use towel bars as grab bars. Use non-skid mats or decals in the tub or shower. If you need to sit down in the shower, use a plastic, non-slip stool. Keep the floor dry. Clean up any water that spills on the floor as soon as it happens. Remove soap buildup in the tub or shower regularly. Attach bath mats securely with double-sided non-slip rug tape. Do not have throw rugs and other things on the  floor that can make you trip. What can I do in the bedroom? Use night lights. Make sure that you have a light by your bed that is easy to reach. Do not use any sheets or blankets that are too big for your bed. They should not hang down onto the floor. Have a firm chair that has side arms. You can use this for support while you get dressed. Do not have throw rugs and other things on the floor that can make you trip. What can I do in the kitchen? Clean up any spills right away. Avoid walking on wet floors. Keep items that you use a lot in easy-to-reach places. If you need to reach something above you, use a strong step stool that has a grab bar. Keep electrical cords out of the way. Do not use floor polish or wax that makes floors slippery. If you must use wax, use non-skid floor wax. Do not have throw rugs and other things on the floor that can make you trip. What can I do with my stairs? Do not leave any items on the stairs. Make sure that there are handrails on both sides of the stairs and use them. Fix handrails that are broken or loose. Make sure that handrails are as long as the stairways. Check any carpeting to make sure that it is firmly attached to the stairs. Fix any carpet that is loose or worn. Avoid having throw rugs at the top or bottom of the  stairs. If you do have throw rugs, attach them to the floor with carpet tape. Make sure that you have a light switch at the top of the stairs and the bottom of the stairs. If you do not have them, ask someone to add them for you. What else can I do to help prevent falls? Wear shoes that: Do not have high heels. Have rubber bottoms. Are comfortable and fit you well. Are closed at the toe. Do not wear sandals. If you use a stepladder: Make sure that it is fully opened. Do not climb a closed stepladder. Make sure that both sides of the stepladder are locked into place. Ask someone to hold it for you, if possible. Clearly mark and make  sure that you can see: Any grab bars or handrails. First and last steps. Where the edge of each step is. Use tools that help you move around (mobility aids) if they are needed. These include: Canes. Walkers. Scooters. Crutches. Turn on the lights when you go into a dark area. Replace any light bulbs as soon as they burn out. Set up your furniture so you have a clear path. Avoid moving your furniture around. If any of your floors are uneven, fix them. If there are any pets around you, be aware of where they are. Review your medicines with your doctor. Some medicines can make you feel dizzy. This can increase your chance of falling. Ask your doctor what other things that you can do to help prevent falls. This information is not intended to replace advice given to you by your health care provider. Make sure you discuss any questions you have with your health care provider. Document Released: 12/29/2008 Document Revised: 08/10/2015 Document Reviewed: 04/08/2014 Elsevier Interactive Patient Education  2017 [employer].

## 2022-01-17 DIAGNOSIS — D225 Melanocytic nevi of trunk: Secondary | ICD-10-CM | POA: Diagnosis not present

## 2022-01-17 DIAGNOSIS — D1722 Benign lipomatous neoplasm of skin and subcutaneous tissue of left arm: Secondary | ICD-10-CM | POA: Diagnosis not present

## 2022-01-17 DIAGNOSIS — L578 Other skin changes due to chronic exposure to nonionizing radiation: Secondary | ICD-10-CM | POA: Diagnosis not present

## 2022-01-17 DIAGNOSIS — L821 Other seborrheic keratosis: Secondary | ICD-10-CM | POA: Diagnosis not present

## 2022-01-18 ENCOUNTER — Encounter: Payer: Medicare Other | Admitting: Family Medicine

## 2022-01-21 ENCOUNTER — Ambulatory Visit (INDEPENDENT_AMBULATORY_CARE_PROVIDER_SITE_OTHER): Payer: Medicare Other | Admitting: Family

## 2022-01-21 ENCOUNTER — Encounter: Payer: Self-pay | Admitting: Family

## 2022-01-21 VITALS — BP 130/76 | HR 61 | Temp 98.5°F | Resp 16 | Ht 70.0 in | Wt 152.2 lb

## 2022-01-21 DIAGNOSIS — R7302 Impaired glucose tolerance (oral): Secondary | ICD-10-CM

## 2022-01-21 DIAGNOSIS — E559 Vitamin D deficiency, unspecified: Secondary | ICD-10-CM | POA: Diagnosis not present

## 2022-01-21 DIAGNOSIS — Z23 Encounter for immunization: Secondary | ICD-10-CM

## 2022-01-21 DIAGNOSIS — E785 Hyperlipidemia, unspecified: Secondary | ICD-10-CM

## 2022-01-21 DIAGNOSIS — I10 Essential (primary) hypertension: Secondary | ICD-10-CM

## 2022-01-21 DIAGNOSIS — M25561 Pain in right knee: Secondary | ICD-10-CM

## 2022-01-21 DIAGNOSIS — G8929 Other chronic pain: Secondary | ICD-10-CM

## 2022-01-21 DIAGNOSIS — R7303 Prediabetes: Secondary | ICD-10-CM | POA: Diagnosis not present

## 2022-01-21 DIAGNOSIS — Z720 Tobacco use: Secondary | ICD-10-CM

## 2022-01-21 DIAGNOSIS — E041 Nontoxic single thyroid nodule: Secondary | ICD-10-CM

## 2022-01-21 DIAGNOSIS — E7800 Pure hypercholesterolemia, unspecified: Secondary | ICD-10-CM | POA: Diagnosis not present

## 2022-01-21 LAB — LIPID PANEL
Cholesterol: 170 mg/dL (ref 0–200)
HDL: 58.6 mg/dL (ref >39.00)
LDL Cholesterol: 95 mg/dL (ref 0–99)
NonHDL: 111.37
Total CHOL/HDL Ratio: 3
Triglycerides: 84 mg/dL (ref 0.0–149.0)
VLDL: 16.8 mg/dL (ref 0.0–40.0)

## 2022-01-21 LAB — CBC
HCT: 44.3 % (ref 36.0–46.0)
Hemoglobin: 14.8 g/dL (ref 12.0–15.0)
MCHC: 33.4 g/dL (ref 30.0–36.0)
MCV: 91.9 fl (ref 78.0–100.0)
Platelets: 183 10*3/uL (ref 150.0–400.0)
RBC: 4.82 Mil/uL (ref 3.87–5.11)
RDW: 12.9 % (ref 11.5–15.5)
WBC: 4.1 10*3/uL (ref 4.0–10.5)

## 2022-01-21 LAB — HEMOGLOBIN A1C: Hgb A1c MFr Bld: 5.8 % (ref 4.6–6.5)

## 2022-01-21 LAB — TSH: TSH: 1.62 u[IU]/mL (ref 0.35–5.50)

## 2022-01-21 LAB — VITAMIN D 25 HYDROXY (VIT D DEFICIENCY, FRACTURES): VITD: 61.64 ng/mL (ref 30.00–100.00)

## 2022-01-21 MED ORDER — ROSUVASTATIN CALCIUM 10 MG PO TABS: 10.0000 mg | ORAL_TABLET | Freq: Every day | ORAL | 2 refills | Status: DC

## 2022-01-21 MED ORDER — AMLODIPINE BESY-BENAZEPRIL HCL 2.5-10 MG PO CAPS: 1.0000 | ORAL_CAPSULE | Freq: Every day | ORAL | 2 refills | Status: DC

## 2022-01-21 NOTE — Assessment & Plan Note (Signed)
Ordering u/s thyroid pending results

## 2022-01-21 NOTE — Progress Notes (Signed)
Established Patient Office Visit  Subjective:  Patient ID: Savannah Mendez, female    DOB: 1955/10/31  Age: 66 y.o. MRN: [id number]  CC:  Chief Complaint  Patient presents with   Transitions Of Care    HPI Savannah Mendez is here for a transition of care visit.  Prior provider was: Dr. Waunita Schooner, MD Pt is without acute concerns.   Mammogram 7/23  Dermatology: annual screening, negative 01/2022, h/o mohs nose. Goes every 6 months.  Colonoscopy: 12/08/2008, cologuard 04/26/20 flu vaccine: declines  Shingles vaccination:  Prevnar 13: 03/15/20  chronic concerns:  HLD: taking crestor 10 mg tolerating well.  HTN: on lotrel tolerating well. No pedal edema.  Blood pressure today is 130/76.   Thyroid nodule: h/o. Bx in the past in 2016 and per pt was negative. 2017 last u/s thyroid. Small 0.8 cm left lobe nodule in the thyroid. Recommendations were for f/u one year thyroid u/s. Last had at [facility].   Tobacco abuse: 40 pack year has tried to quit at times, but stressful.   Past Medical History:  Diagnosis Date   Allergy    to medication   Arthritis    knee    Depression    Fainting spell    High cholesterol    Hypercholesterolemia    Hyperlipidemia    Phreesia 09/10/2019   Hypertension    Mixed hyperlipidemia    Postmenopausal    Pure hypercholesterolemia    Thyroid nodule    Tobacco abuse     Past Surgical History:  Procedure Laterality Date   CESAREAN SECTION  1992    Family History  Problem Relation Age of Onset   Hyperlipidemia Mother    Hypertension Mother    Stroke Mother 69   Hypertension Father    Hypertension Sister    Acute myelogenous leukemia Sister    Breast cancer Maternal Aunt        in her 39's   Heart attack Paternal Aunt    Lung cancer Paternal Uncle    Heart attack Paternal Uncle     Social History   Socioeconomic History   Marital status: Married    Spouse name: Louie Casa   Number of children: 2    Years of education: Associates Degree   Highest education level: Not on file  Occupational History   Occupation: retired  Tobacco Use   Smoking status: Every Day    Packs/day: 1.00    Years: 40.00    Total pack years: 40.00    Types: Cigarettes   Smokeless tobacco: Never   Tobacco comments:    Patient has not tried Chantix or Equip  Scientific laboratory technician Use: Never used  Substance and Sexual Activity   Alcohol use: Yes    Comment: rarely   Drug use: No   Sexual activity: Yes    Partners: Male    Birth control/protection: Post-menopausal    Comment: husband's health  Other Topics Concern   Not on file  Social History Narrative   09/14/19   From: the area   Living: with husband, Louie Casa (1992)   Work: retired from Community education [employer]       Family: [place] Poe and [place] - one step grandson      Enjoys: gardening      Exercise: gardening - a few times a week   Diet: more meat than she wants - has a hard time with husband's taste  Safety   Seat belts: Yes    Guns: Yes  and secure   Safe in relationships: Yes    Social Determinants of Health   Financial Resource Strain: Low Risk  (01/14/2022)   Overall Financial Resource Strain (CARDIA)    Difficulty of Paying Living Expenses: Not hard at all  Food Insecurity: No Food Insecurity (01/14/2022)   Hunger Vital Sign    Worried About Running Out of Food in the Last Year: Never true    Ran Out of Food in the Last Year: Never true  Transportation Needs: No Transportation Needs (01/14/2022)   PRAPARE - Hydrologist (Medical): No    Lack of Transportation (Non-Medical): No  Physical Activity: Inactive (01/14/2022)   Exercise Vital Sign    Days of Exercise per Week: 0 days    Minutes of Exercise per Session: 0 min  Stress: No Stress Concern Present (01/14/2022)   [place]    Feeling of Stress : Not at all   Social Connections: Not on file  Intimate Partner Violence: Not on file    Outpatient Medications Prior to Visit  Medication Sig Dispense Refill   Cholecalciferol (VITAMIN D3) 5000 units CAPS Take 1 capsule by mouth daily.     Coenzyme Q10 (COQ10 PO) Take 1 capsule by mouth daily.     Omega 3 1200 MG CAPS Take 1 capsule by mouth daily.     amlodipine-benazepril (LOTREL) 2.5-10 MG capsule TAKE 1 CAPSULE BY MOUTH DAILY. 90 capsule 3   rosuvastatin (CRESTOR) 10 MG tablet Take 1 tablet (10 mg total) by mouth daily. 90 tablet 3   Ascorbic Acid (VITAMIN C PO) Take 500 mg by mouth daily at 12 noon. (Patient not taking: Reported on 01/21/2022)     No facility-administered medications prior to visit.    Allergies  Allergen Reactions   Paxil [Paroxetine] Other (See Comments)    Made her feel funny    Tyloxapol Other (See Comments)    Tylox - headaches        Objective:    Physical Exam Vitals reviewed.  Constitutional:      General: She is not in acute distress.    Appearance: Normal appearance. She is not ill-appearing or toxic-appearing.  HENT:     Right Ear: Tympanic membrane normal.     Left Ear: Tympanic membrane normal.     Mouth/Throat:     Mouth: Mucous membranes are moist.     Pharynx: No pharyngeal swelling.     Tonsils: No tonsillar exudate.  Eyes:     Extraocular Movements: Extraocular movements intact.     Conjunctiva/sclera: Conjunctivae normal.     Pupils: Pupils are equal, round, and reactive to light.  Neck:     Thyroid: No thyroid mass.  Cardiovascular:     Rate and Rhythm: Normal rate and regular rhythm.  Pulmonary:     Effort: Pulmonary effort is normal.     Breath sounds: Normal breath sounds.  Musculoskeletal:        General: Normal range of motion.  Lymphadenopathy:     Cervical:     Right cervical: No superficial cervical adenopathy.    Left cervical: No superficial cervical adenopathy.  Skin:    General: Skin is warm.     Capillary Refill:  Capillary refill takes less than 2 seconds.  Neurological:     General: No focal deficit present.     Mental Status:  She is alert and oriented to person, place, and time.  Psychiatric:        Mood and Affect: Mood normal.        Behavior: Behavior normal.        Thought Content: Thought content normal.        Judgment: Judgment normal.       BP 130/76   Pulse 61   Temp 98.5 F (36.9 C)   Resp 16   Ht '5\' 10"'$  (1.778 m)   Wt 152 lb 4 oz (69.1 kg)   SpO2 98%   BMI 21.85 kg/m  Wt Readings from Last 3 Encounters:  01/21/22 152 lb 4 oz (69.1 kg)  01/14/22 154 lb (69.9 kg)  01/09/21 167 lb (75.8 kg)     Health Maintenance Due  Topic Date Due   Lung Cancer Screening  Never done    There are no preventive care reminders to display for this patient.  Lab Results  Component Value Date   TSH 1.92 01/09/2021   Lab Results  Component Value Date   WBC 4.7 09/10/2019   HGB 15.3 09/10/2019   HCT 45.4 09/10/2019   MCV 93 09/10/2019   PLT 191 09/10/2019   Lab Results  Component Value Date   NA 137 01/09/2021   K 4.1 01/09/2021   CO2 29 01/09/2021   GLUCOSE 88 01/09/2021   BUN 16 01/09/2021   CREATININE 0.76 01/09/2021   BILITOT 0.5 01/09/2021   ALKPHOS 63 01/09/2021   AST 17 01/09/2021   ALT 13 01/09/2021   PROT 6.9 01/09/2021   ALBUMIN 4.2 01/09/2021   CALCIUM 9.4 01/09/2021   GFR 81.77 01/09/2021   Lab Results  Component Value Date   CHOL 270 (H) 01/09/2021   Lab Results  Component Value Date   HDL 49.90 01/09/2021   Lab Results  Component Value Date   LDLCALC 199 (H) 01/09/2021   Lab Results  Component Value Date   TRIG 104.0 01/09/2021   Lab Results  Component Value Date   CHOLHDL 5 01/09/2021   Lab Results  Component Value Date   HGBA1C 5.7 01/09/2021      Assessment & Plan:   Problem List Items Addressed This Visit       Cardiovascular and Mediastinum   Hypertension (Chronic)    Refill sent for lotrel.  Pt advised of the  following:  Continue medication as prescribed. Monitor blood pressure periodically and/or when you feel symptomatic. Goal is <130/90 on average. Ensure that you have rested for 30 minutes prior to checking your blood pressure. Record your readings and bring them to your next visit if necessary.work on a low sodium diet.       Relevant Medications   rosuvastatin (CRESTOR) 10 MG tablet   amlodipine-benazepril (LOTREL) 2.5-10 MG capsule     Endocrine   Glucose intolerance (impaired glucose tolerance) (Chronic)    Pt advised of the following: Work on a diabetic diet, try to incorporate exercise at least 20-30 a day for 3 days a week or more.        Left thyroid nodule    Ordering u/s thyroid pending results      Relevant Orders   US THYROID   TSH     Other   HLD (hyperlipidemia) (Chronic)    Continue crestor 10 mg  Ordered lipid panel, pending results. Work on low cholesterol diet and exercise as tolerated Consider zetia pending results      Relevant Medications  rosuvastatin (CRESTOR) 10 MG tablet   amlodipine-benazepril (LOTREL) 2.5-10 MG capsule   Tobacco abuse (Chronic)    Smoking cessation instruction/counseling given:  counseled patient on the dangers of tobacco use, advised patient to stop smoking, and reviewed strategies to maximize success  Referral placed for lung cancer screening       Relevant Orders   Ambulatory Referral Lung Cancer Screening [facility]   CBC   Chronic pain of right knee - Primary    stable      Vitamin D deficiency   Relevant Orders   VITAMIN D 25 Hydroxy (Vit-D Deficiency, Fractures)   Elevated LDL cholesterol level   Relevant Orders   Lipid panel   Prediabetes   Relevant Orders   Hemoglobin A1c   Other Visit Diagnoses     Need for vaccination          Educated pt on shingrix and also pneumonococcal 23. She will consider pneumonococcal, and will make nurse visit if she decides to obtain this vaccine. She will hold off  on shingles vaccination for now.  Meds ordered this encounter  Medications   rosuvastatin (CRESTOR) 10 MG tablet    Sig: Take 1 tablet (10 mg total) by mouth daily.    Dispense:  90 tablet    Refill:  2    Order Specific Question:   Supervising Provider    Answer:   BEDSOLE, AMY E [[id number]]   amlodipine-benazepril (LOTREL) 2.5-10 MG capsule    Sig: Take 1 capsule by mouth daily.    Dispense:  90 capsule    Refill:  2    Order Specific Question:   Supervising Provider    Answer:   Diona Browner, AMY E [[id number]]    Follow-up: Return in about 6 months (around 07/22/2022) for f/u cholesterol.    Eugenia Pancoast, FNP

## 2022-01-21 NOTE — Assessment & Plan Note (Signed)
Continue crestor 10 mg  Ordered lipid panel, pending results. Work on low cholesterol diet and exercise as tolerated Consider zetia pending results

## 2022-01-21 NOTE — Assessment & Plan Note (Signed)
Smoking cessation instruction/counseling given:  counseled patient on the dangers of tobacco use, advised patient to stop smoking, and reviewed strategies to maximize success  Referral placed for lung cancer screening

## 2022-01-21 NOTE — Assessment & Plan Note (Signed)
Pt advised of the following: Work on a diabetic diet, try to incorporate exercise at least 20-30 a day for 3 days a week or more.   

## 2022-01-21 NOTE — Assessment & Plan Note (Signed)
Refill sent for lotrel.  Pt advised of the following:  Continue medication as prescribed. Monitor blood pressure periodically and/or when you feel symptomatic. Goal is <130/90 on average. Ensure that you have rested for 30 minutes prior to checking your blood pressure. Record your readings and bring them to your next visit if necessary.work on a low sodium diet.

## 2022-01-21 NOTE — Assessment & Plan Note (Signed)
stable °

## 2022-01-21 NOTE — Patient Instructions (Addendum)
  Your imaging for your thyroid ultrasound Has been ordered at the following location.  Please call to schedule a time and date that would work for you.    [place] Phone [phone],  8-430 pm    ------------------------------------  A referral was placed today for lung cancer screening. Please let us know if you have not heard back within 2 weeks about the referral.   ------------------------------------  Recommend the pneumonia vaccination 23. This will complete your series.    ------------------------------------  Welcome to our clinic, I am happy to have you as my new patient. I am excited to continue on this healthcare journey with you.  Stop by the lab prior to leaving today. I will notify you of your results once received.   Please keep in mind Any my chart messages you send have up to a three business day turnaround for a response.  Phone calls may take up to a one full business day turnaround for a  response.   If you need a medication refill I recommend you request it through the pharmacy as this is easiest for [place] rather than sending a message and or phone call.   Due to recent changes in healthcare laws, you may see results of your imaging and/or laboratory studies on MyChart before I have had a chance to review them.  I understand that in some cases there may be results that are confusing or concerning to you. Please understand that not all results are received at the same time and often I may need to interpret multiple results in order to provide you with the best plan of care or course of treatment. Therefore, I ask that you please give me 2 business days to thoroughly review all your results before contacting my office for clarification. Should we see a critical lab result, you will be contacted sooner.   It was a pleasure seeing you today! Please do not hesitate to reach out with any questions and or concerns.  Regards,   Eugenia Pancoast FNP-C

## 2022-01-22 NOTE — Progress Notes (Signed)
Thayer Headings your cholesterol is much worse.  I do remember that you are fasting and you do not want to increase your statin level.  I did mention a medication called Zetia if you are willing to add this to your regimen please let me know.  I do not know necessarily if this will be enough but it is worth a try.  Your LDL which is your bad cholesterol is at 199, and your desired goal is less than 70.  That really puts you at an increased risk for stroke and/or heart disease.  You are also prediabetic at 5.8.  This is not terrible and it has remained stable over the years but remember to keep control on your diet and focus on a diabetic diet and exercise as tolerated  Vitamin D is at good level in the 60s right now.

## 2022-01-25 NOTE — Progress Notes (Signed)
Savannah Mendez,   So sorry for confusion, my computer had pulled up one year ago vs 4 days ago.   Your cholesterol is actually much improved. LDL (bad cholesterol) was 199, now is 95. Goal is still <70 however I feel good about this improvement.  You are also prediabetic at 5.8.  This is not terrible and it has remained stable over the years but remember to keep control on your diet and focus on a diabetic diet and exercise as tolerated  Vitamin D is at good level in the 60s right now.

## 2022-07-16 ENCOUNTER — Ambulatory Visit
Admission: EM | Admit: 2022-07-16 | Discharge: 2022-07-16 | Disposition: A | Discharge status: Home / Self Care | Payer: Medicare Other | Attending: Internal Medicine | Admitting: Internal Medicine

## 2022-07-16 DIAGNOSIS — R21 Rash and other nonspecific skin eruption: Secondary | ICD-10-CM | POA: Diagnosis not present

## 2022-07-16 DIAGNOSIS — S90862A Insect bite (nonvenomous), left foot, initial encounter: Secondary | ICD-10-CM

## 2022-07-16 DIAGNOSIS — W57XXXA Bitten or stung by nonvenomous insect and other nonvenomous arthropods, initial encounter: Secondary | ICD-10-CM | POA: Diagnosis not present

## 2022-07-16 MED ORDER — DOXYCYCLINE HYCLATE 100 MG PO CAPS: 100.0000 mg | ORAL_CAPSULE | Freq: Two times a day (BID) | ORAL | 0 refills | Status: DC

## 2022-07-16 NOTE — ED Provider Notes (Signed)
[facility]    CSN: [id number] Arrival date & time: 07/16/22  1022      History   Chief Complaint Chief Complaint  Patient presents with   Insect Bite    HPI Savannah Mendez is a 67 y.o. female.   Patient presents today with concern of insect bite to her left foot.  Patient notes 3 different areas with swelling, discoloration, itchiness.  Reports that she did not witness or feel anything bite her.  Denies any associated fever, nausea, vomiting, chills.  Symptoms started approximately 4 days ago.  She has applied bacitracin topically and taken Benadryl.     Past Medical History:  Diagnosis Date   Allergy    to medication   Arthritis    knee    Depression    Fainting spell    High cholesterol    Hypercholesterolemia    Hyperlipidemia    Phreesia 09/10/2019   Hypertension    Mixed hyperlipidemia    Postmenopausal    Pure hypercholesterolemia    Thyroid nodule    Tobacco abuse     Patient Active Problem List   Diagnosis Date Noted   Vitamin D deficiency 01/21/2022   Elevated LDL cholesterol level 01/21/2022   Prediabetes 01/21/2022   Tobacco abuse 02/05/2018   Glucose intolerance (impaired glucose tolerance) 03/27/2017   Statin intolerance- only able to tolerate small dose 03/27/2017   Chronic pain of right knee 03/27/2017   Hypertension 02/04/2017   HLD (hyperlipidemia) 02/04/2017   Postmenopausal- 2008 02/04/2017   Left thyroid nodule 02/04/2017    Past Surgical History:  Procedure Laterality Date   CESAREAN SECTION  1992    OB History   No obstetric history on file.      Home Medications    Prior to Admission medications   Medication Sig Start Date End Date Taking? Authorizing Provider  doxycycline (VIBRAMYCIN) 100 MG capsule Take 1 capsule (100 mg total) by mouth 2 (two) times daily. 07/16/22  Yes Dacota Devall, Rolly Salter E, FNP  amlodipine-benazepril (LOTREL) 2.5-10 MG capsule Take 1 capsule by mouth daily. 01/21/22   Mort Sawyers, FNP   Cholecalciferol (VITAMIN D3) 5000 units CAPS Take 1 capsule by mouth daily.    [provider]  Coenzyme Q10 (COQ10 PO) Take 1 capsule by mouth daily.    [provider]  Omega 3 1200 MG CAPS Take 1 capsule by mouth daily.    [provider]  rosuvastatin (CRESTOR) 10 MG tablet Take 1 tablet (10 mg total) by mouth daily. 01/21/22   Mort Sawyers, FNP    Family History Family History  Problem Relation Age of Onset   Hyperlipidemia Mother    Hypertension Mother    Stroke Mother 29   Hypertension Father    Hypertension Sister    Acute myelogenous leukemia Sister    Breast cancer Maternal Aunt        in her 46's   Heart attack Paternal Aunt    Lung cancer Paternal Uncle    Heart attack Paternal Uncle     Social History Social History   Tobacco Use   Smoking status: Every Day    Packs/day: 1.00    Years: 40.00    Additional pack years: 0.00    Total pack years: 40.00    Types: Cigarettes   Smokeless tobacco: Never   Tobacco comments:    Patient has not tried Chantix or Equip  Vaping Use   Vaping Use: Never used  Substance Use Topics  Alcohol use: Yes    Comment: rarely   Drug use: No     Allergies   Paxil [paroxetine] and Tyloxapol   Review of Systems Review of Systems Per HPI  Physical Exam Triage Vital Signs ED Triage Vitals  Enc Vitals Group     BP 07/16/22 1039 (!) 149/83     Pulse Rate 07/16/22 1039 80     Resp 07/16/22 1039 16     Temp 07/16/22 1039 98.2 F (36.8 C)     Temp Source 07/16/22 1039 Oral     SpO2 07/16/22 1039 95 %     Weight --      Height --      Head Circumference --      Peak Flow --      Pain Score 07/16/22 1041 0     Pain Loc --      Pain Edu? --      Excl. in GC? --    No data found.  Updated Vital Signs BP (!) 149/83 (BP Location: Left Arm)   Pulse 80   Temp 98.2 F (36.8 C) (Oral)   Resp 16   SpO2 95%   Visual Acuity Right Eye Distance:   Left Eye Distance:   Bilateral Distance:     Right Eye Near:   Left Eye Near:    Bilateral Near:     Physical Exam Constitutional:      General: She is not in acute distress.    Appearance: Normal appearance. She is not toxic-appearing or diaphoretic.  HENT:     Head: Normocephalic and atraumatic.  Eyes:     Extraocular Movements: Extraocular movements intact.     Conjunctiva/sclera: Conjunctivae normal.  Pulmonary:     Effort: Pulmonary effort is normal.  Feet:     Comments: Patient has approximately 1 to 2 cm in diameter bluish-black discolored area present to the dorsal surface of the mid left great toe.  Surrounding erythema with no associated swelling.  Has a blister lesion present to the medial aspect of the foot directly below the left great toe with surrounding mild erythema and swelling.  Patient has approximately 0.5 cm area of erythema present to medial ankle as well.  No purulent drainage noted from any of these areas.  Patient has full range of motion of foot and toes and is neurovascularly intact. Neurological:     General: No focal deficit present.     Mental Status: She is alert and oriented to person, place, and time. Mental status is at baseline.  Psychiatric:        Mood and Affect: Mood normal.        Behavior: Behavior normal.        Thought Content: Thought content normal.        Judgment: Judgment normal.      UC Treatments / Results  Labs (all labs ordered are listed, but only abnormal results are displayed) Labs Reviewed - No data to display  EKG   Radiology No results found.  Procedures Procedures (including critical care time)  Medications Ordered in UC Medications - No data to display  Initial Impression / Assessment and Plan / UC Course  I have reviewed the triage vital signs and the nursing notes.  Pertinent labs & imaging results that were available during my care of the patient were reviewed by me and considered in my medical decision making (see chart for details).      Patient's appearance of foot  appears consistent with insect versus spider bite.  One of the lesions does appear bluish-black discolored but there are no obvious signs of necrosis or osteomyelitis at this time.  Therefore, patient was advised to monitor very closely and to follow-up if symptoms persist or worsen.  Also encouraged patient to call family medicine doctor and have reevaluation in the next few days.  Patient was sent doxycycline given surrounding erythema to treat for any type of cellulitis.  Patient advised to take this medication with food.  Advised cool compresses for comfort.  Will leave blister intact to prevent any further infection.  Advised following up with urgent care sooner if symptoms persist or worsen.  Patient verbalized understanding and was agreeable with plan. Final Clinical Impressions(s) / UC Diagnoses   Final diagnoses:  Rash and nonspecific skin eruption  Insect bite of left foot, initial encounter     Discharge Instructions      It appears that something is biting you.  I have prescribed an antibiotic to treat infection associated with this.  You may use cool compresses to help with itchiness and discomfort.  I recommend that you follow-up with family medicine doctor this week to have reevaluation and ensure adequate healing.  Monitor for any increased redness, black discoloration, purulent drainage, swelling and follow-up sooner if this occurs.   ED Prescriptions     Medication Sig Dispense Auth. Provider   doxycycline (VIBRAMYCIN) 100 MG capsule Take 1 capsule (100 mg total) by mouth 2 (two) times daily. 20 capsule Gustavus Bryant, [place]      PDMP not reviewed this encounter.   Gustavus Bryant, [place] 07/16/22 [phone]

## 2022-07-16 NOTE — Discharge Instructions (Signed)
It appears that something is biting you.  I have prescribed an antibiotic to treat infection associated with this.  You may use cool compresses to help with itchiness and discomfort.  I recommend that you follow-up with family medicine doctor this week to have reevaluation and ensure adequate healing.  Monitor for any increased redness, black discoloration, purulent drainage, swelling and follow-up sooner if this occurs.

## 2022-07-16 NOTE — ED Triage Notes (Signed)
Pt present insect bite to her left foot/big toe, pt states the area is itching and burning. Symptom started as a tiny blister and she noticed it Saturday.

## 2022-07-18 ENCOUNTER — Ambulatory Visit (INDEPENDENT_AMBULATORY_CARE_PROVIDER_SITE_OTHER): Payer: Medicare Other | Admitting: Family

## 2022-07-18 ENCOUNTER — Encounter: Payer: Self-pay | Admitting: Family

## 2022-07-18 VITALS — BP 122/68 | HR 64 | Temp 97.9°F | Ht 70.0 in | Wt 137.8 lb

## 2022-07-18 DIAGNOSIS — S91301A Unspecified open wound, right foot, initial encounter: Secondary | ICD-10-CM | POA: Insufficient documentation

## 2022-07-18 DIAGNOSIS — D1722 Benign lipomatous neoplasm of skin and subcutaneous tissue of left arm: Secondary | ICD-10-CM | POA: Insufficient documentation

## 2022-07-18 DIAGNOSIS — M26220 Open anterior occlusal relationship: Secondary | ICD-10-CM | POA: Insufficient documentation

## 2022-07-18 MED ORDER — MUPIROCIN 2 % EX OINT: 1.0000 | TOPICAL_OINTMENT | Freq: Two times a day (BID) | CUTANEOUS | 0 refills | Status: DC

## 2022-07-18 MED ORDER — PREDNISONE 20 MG PO TABS: ORAL_TABLET | ORAL | 0 refills | Status: DC

## 2022-07-18 NOTE — Patient Instructions (Signed)
Continue doxycycline  Start prednisone as directed.   Please monitor site for worsening signs/symptoms of infection to include: increasing redness, increasing tenderness, increase in size, and or pustulant drainage from site. If this is to occur please let me know immediately.    Regards,   Mort Sawyers FNP-C

## 2022-07-18 NOTE — Progress Notes (Signed)
Established Patient Office Visit  Subjective:   Patient ID: Savannah Mendez, female    DOB: Jul 18, 1955  Age: 67 y.o. MRN: [id number]  CC:  Chief Complaint  Patient presents with   Insect Bite    Left foot    HPI: Savannah Mendez is a 67 y.o. female presenting on 07/18/2022 for Insect Bite (Left foot)  HPI  About 5-6 days ago after being out in the garden wearing clogs noticed on  Left foot started with blister on left great toe that looked like a blood blister per pt, and then noticed around the same time maybe slightly after a blister that started forming on her right heel pad. She then noticed a few days later a small red lesion on the base of her left ankle. She does have red ants in her yard and not sure if this was an issue.   Currently on day three of doxycycline 100 mg po bid, total duration of ten days. She is also applying bacitracin at home.        ROS: Negative unless specifically indicated above in HPI.   Relevant past medical history reviewed and updated as indicated.   Allergies and medications reviewed and updated.   Current Outpatient Medications:    amlodipine-benazepril (LOTREL) 2.5-10 MG capsule, Take 1 capsule by mouth daily., Disp: 90 capsule, Rfl: 2   Cholecalciferol (VITAMIN D3) 5000 units CAPS, Take 1 capsule by mouth daily., Disp: , Rfl:    Coenzyme Q10 (COQ10 PO), Take 1 capsule by mouth daily., Disp: , Rfl:    doxycycline (VIBRAMYCIN) 100 MG capsule, Take 1 capsule (100 mg total) by mouth 2 (two) times daily., Disp: 20 capsule, Rfl: 0   mupirocin ointment (BACTROBAN) 2 %, Apply 1 Application topically 2 (two) times daily., Disp: 22 g, Rfl: 0   Omega 3 1200 MG CAPS, Take 1 capsule by mouth daily., Disp: , Rfl:    predniSONE (DELTASONE) 20 MG tablet, Take one tablet daily for five days, Disp: 5 tablet, Rfl: 0   rosuvastatin (CRESTOR) 10 MG tablet, Take 1 tablet (10 mg total) by mouth daily., Disp: 90 tablet, Rfl: 2  Allergies  Allergen Reactions    Paxil [Paroxetine] Other (See Comments)    Made her feel funny    Tyloxapol Other (See Comments)    Tylox - headaches    Objective:   BP 122/68 (BP Location: Left Arm)   Pulse 64   Temp 97.9 F (36.6 C) (Temporal)   Ht 5\' 10"  (1.778 m)   Wt 137 lb 12.8 oz (62.5 kg)   SpO2 98%   BMI 19.77 kg/m    Physical Exam Constitutional:      General: She is not in acute distress.    Appearance: Normal appearance. She is normal weight. She is not ill-appearing, toxic-appearing or diaphoretic.  HENT:     Head: Normocephalic.  Cardiovascular:     Rate and Rhythm: Normal rate.  Pulmonary:     Effort: Pulmonary effort is normal.  Musculoskeletal:        General: Normal range of motion.  Skin:    Comments: 3 cm diameter blister roofed on left great toe lateral side with mild surrounding erythema. Open nontender no erythema right lateral side of left great toe bloody discharge and right lower ankle with small annular lesion healing with mild erythema no discharge.    Neurological:     General: No focal deficit present.     Mental Status: She is alert and  oriented to person, place, and time. Mental status is at baseline.  Psychiatric:        Mood and Affect: Mood normal.        Behavior: Behavior normal.        Thought Content: Thought content normal.        Judgment: Judgment normal.            Assessment & Plan:  Wound of right foot -     Mupirocin; Apply 1 Application topically 2 (two) times daily.  Dispense: 22 g; Refill: 0 -     WOUND CULTURE -     predniSONE; Take one tablet daily for five days  Dispense: 5 tablet; Refill: 0  Mild anterior open bite Assessment & Plan: Suspected ant bite with cellulitis Rx mupirocin ointment  Continue doxycycline for complete duration  Rx prednisone for inflammation   Orders: -     predniSONE; Take one tablet daily for five days  Dispense: 5 tablet; Refill: 0     Follow up plan: Return if symptoms worsen or fail to  improve.  Mort Sawyers, FNP

## 2022-07-18 NOTE — Assessment & Plan Note (Signed)
Suspected ant bite with cellulitis Rx mupirocin ointment  Continue doxycycline for complete duration  Rx prednisone for inflammation

## 2022-07-21 LAB — WOUND CULTURE
MICRO NUMBER:: 14905140
SPECIMEN QUALITY:: ADEQUATE

## 2022-07-22 NOTE — Progress Notes (Signed)
Wound culture unremarkable.  Has the medication helped some?

## 2022-07-23 NOTE — Progress Notes (Signed)
Ok . Advise her to continue doxycycline for 10 days total.  Monitor daily for s/s infection.  If does not seem to completely resolve and or worsening, please make f/u.

## 2022-07-24 ENCOUNTER — Telehealth: Payer: Self-pay | Admitting: Family

## 2022-07-24 NOTE — Telephone Encounter (Signed)
Patient called in returning a call she received.  

## 2022-07-25 NOTE — Telephone Encounter (Signed)
See results message 

## 2022-07-29 ENCOUNTER — Ambulatory Visit (INDEPENDENT_AMBULATORY_CARE_PROVIDER_SITE_OTHER): Payer: Medicare Other | Admitting: Family

## 2022-07-29 ENCOUNTER — Encounter: Payer: Self-pay | Admitting: Family

## 2022-07-29 VITALS — BP 118/70 | HR 60 | Temp 97.8°F | Ht 70.0 in | Wt 138.4 lb

## 2022-07-29 DIAGNOSIS — M26220 Open anterior occlusal relationship: Secondary | ICD-10-CM | POA: Diagnosis not present

## 2022-07-29 NOTE — Progress Notes (Signed)
Established Patient Office Visit  Subjective:      CC:  Chief Complaint  Patient presents with   Foot Injury    Follow up on foot sore.    HPI: Savannah Mendez is a 67 y.o. female presenting on 07/29/2022 for Foot Injury (Follow up on foot sore.) .       Social history:  Relevant past medical, surgical, family and social history reviewed and updated as indicated. Interim medical history since our last visit reviewed.  Allergies and medications reviewed and updated.  DATA REVIEWED: CHART IN EPIC     ROS: Negative unless specifically indicated above in HPI.    Current Outpatient Medications:    amlodipine-benazepril (LOTREL) 2.5-10 MG capsule, Take 1 capsule by mouth daily., Disp: 90 capsule, Rfl: 2   Cholecalciferol (VITAMIN D3) 5000 units CAPS, Take 1 capsule by mouth daily., Disp: , Rfl:    Coenzyme Q10 (COQ10 PO), Take 1 capsule by mouth daily., Disp: , Rfl:    mupirocin ointment (BACTROBAN) 2 %, Apply 1 Application topically 2 (two) times daily., Disp: 22 g, Rfl: 0   Omega 3 1200 MG CAPS, Take 1 capsule by mouth daily., Disp: , Rfl:    rosuvastatin (CRESTOR) 10 MG tablet, Take 1 tablet (10 mg total) by mouth daily., Disp: 90 tablet, Rfl: 2      Objective:    BP 118/70 (BP Location: Right Arm)   Pulse 60   Temp 97.8 F (36.6 C) (Temporal)   Ht 5\' 10"  (1.778 m)   Wt 138 lb 6.4 oz (62.8 kg)   SpO2 98%   BMI 19.86 kg/m   Wt Readings from Last 3 Encounters:  07/29/22 138 lb 6.4 oz (62.8 kg)  07/18/22 137 lb 12.8 oz (62.5 kg)  01/21/22 152 lb 4 oz (69.1 kg)    Physical Exam Constitutional:      General: She is not in acute distress.    Appearance: Normal appearance. She is normal weight. She is not ill-appearing, toxic-appearing or diaphoretic.  HENT:     Head: Normocephalic.  Cardiovascular:     Rate and Rhythm: Normal rate.  Pulmonary:     Effort: Pulmonary effort is normal.  Musculoskeletal:        General: Normal range of motion.  Skin:     Comments: 2 cm diameter blister with dried open roofing on left great toe lateral side with no erythema no discharge not warm to touch. Open nontender no erythema right lateral side of left great toe without discharge, improvement from last visit, and right lower ankle with small annular lesion healing with mild erythema no discharge.    Neurological:     General: No focal deficit present.     Mental Status: She is alert and oriented to person, place, and time. Mental status is at baseline.  Psychiatric:        Mood and Affect: Mood normal.        Behavior: Behavior normal.        Thought Content: Thought content normal.        Judgment: Judgment normal.              Assessment & Plan:  Mild anterior open bite Assessment & Plan: Lesions x 3 improving.  Blister right lateral great toe with complete healing, just awaiting dead skin from blister to slough off. Pt advised to:  Please monitor site for worsening signs/symptoms of infection to include: increasing redness, increasing tenderness, increase in size, and or  pustulant drainage from site. If this is to occur please let me know immediately.        Return in about 6 months (around 01/29/2023) for f/u CPE.  Mort Sawyers, MSN, APRN, FNP-C [facility]

## 2022-07-29 NOTE — Assessment & Plan Note (Signed)
Lesions x 3 improving.  Blister right lateral great toe with complete healing, just awaiting dead skin from blister to slough off. Pt advised to:  Please monitor site for worsening signs/symptoms of infection to include: increasing redness, increasing tenderness, increase in size, and or pustulant drainage from site. If this is to occur please let me know immediately.

## 2022-08-07 DIAGNOSIS — D225 Melanocytic nevi of trunk: Secondary | ICD-10-CM | POA: Diagnosis not present

## 2022-08-07 DIAGNOSIS — D224 Melanocytic nevi of scalp and neck: Secondary | ICD-10-CM | POA: Diagnosis not present

## 2022-08-07 DIAGNOSIS — L821 Other seborrheic keratosis: Secondary | ICD-10-CM | POA: Diagnosis not present

## 2022-08-07 DIAGNOSIS — L578 Other skin changes due to chronic exposure to nonionizing radiation: Secondary | ICD-10-CM | POA: Diagnosis not present

## 2022-08-07 DIAGNOSIS — D1722 Benign lipomatous neoplasm of skin and subcutaneous tissue of left arm: Secondary | ICD-10-CM | POA: Diagnosis not present

## 2022-10-15 DIAGNOSIS — Z1231 Encounter for screening mammogram for malignant neoplasm of breast: Secondary | ICD-10-CM | POA: Diagnosis not present

## 2023-01-15 ENCOUNTER — Ambulatory Visit: Payer: Medicare Other

## 2023-01-15 VITALS — Ht 70.0 in | Wt 144.0 lb

## 2023-01-15 DIAGNOSIS — Z0000 Encounter for general adult medical examination without abnormal findings: Secondary | ICD-10-CM

## 2023-01-15 DIAGNOSIS — Z122 Encounter for screening for malignant neoplasm of respiratory organs: Secondary | ICD-10-CM | POA: Diagnosis not present

## 2023-01-15 NOTE — Patient Instructions (Addendum)
Savannah Mendez , Thank you for taking time to come for your Medicare Wellness Visit. I appreciate your ongoing commitment to your health goals. Please review the following plan we discussed and let me know if I can assist you in the future.   Referrals/Orders/Follow-Ups/Clinician Recommendations: Aim for 30 minutes of exercise or brisk walking, 6-8 glasses of water, and 5 servings of fruits and vegetables each day.   Tobacco Use: High Risk (01/15/2023)   Patient History    Smoking Tobacco Use: Every Day    Smokeless Tobacco Use: Never    Passive Exposure: Not on file      This is a list of the screening recommended for you and due dates:  Health Maintenance  Topic Date Due   Zoster (Shingles) Vaccine (1 of 2) Never done   Screening for Lung Cancer  Never done   Flu Shot  Never done   Pneumonia Vaccine (2 of 2 - PPSV23 or PCV20) 01/22/2023*   COVID-19 Vaccine (3 - Pfizer risk series) 07/29/2023*   Medicare Annual Wellness Visit  01/15/2024   Mammogram  10/14/2024   DTaP/Tdap/Td vaccine (3 - Td or Tdap) 09/08/2028   Colon Cancer Screening  04/26/2030   DEXA scan (bone density measurement)  Completed   Hepatitis C Screening  Completed   HPV Vaccine  Aged Out  *Topic was postponed. The date shown is not the original due date.    Advanced directives: (Copy Requested) Please bring a copy of your health care power of attorney and living will to the office to be added to your chart at your convenience.  Next Medicare Annual Wellness Visit scheduled for next year: Yes  Insert Preventive Care attachment Insert FALL PREVENTION attachment if needed

## 2023-01-15 NOTE — Progress Notes (Signed)
Subjective:   Savannah Mendez is a 67 y.o. female who presents for Medicare Annual (Subsequent) preventive examination.  Visit Complete: Virtual I connected with  Savannah Mendez on 01/15/23 by a audio enabled telemedicine application and verified that I am speaking with the correct person using two identifiers.  Patient Location: Home  Provider Location: Home Office  I discussed the limitations of evaluation and management by telemedicine. The patient expressed understanding and agreed to proceed.  Vital Signs: Because this visit was a virtual/telehealth visit, some criteria may be missing or patient reported. Any vitals not documented were not able to be obtained and vitals that have been documented are patient reported.  Patient Medicare AWV questionnaire was completed by the patient on 01/15/2023; I have confirmed that all information answered by patient is correct and no changes since this date.  Cardiac Risk Factors include: advanced age (>80men, >26 women)     Objective:    Today's Vitals   01/15/23 1304  Weight: 144 lb (65.3 kg)  Height: 5\' 10"  (1.778 m)   Body mass index is 20.66 kg/m.     01/15/2023    1:08 PM 01/14/2022   10:17 AM 01/09/2021   11:08 AM 12/17/2017   11:14 AM 02/04/2017   11:27 AM  Advanced Directives  Does Patient Have a Medical Advance Directive? Yes No No Yes Yes  Type of Estate agent of [place];Living will   Living will;Healthcare Power of [employer] Power of [place];Living will  Does patient want to make changes to medical advance directive?   Yes (MAU/Ambulatory/Procedural Areas - Information given)    Copy of Healthcare Power of Attorney in Chart? No - copy requested   No - copy requested     Current Medications (verified) Outpatient Encounter Medications as of 01/15/2023  Medication Sig   amlodipine-benazepril (LOTREL) 2.5-10 MG capsule Take 1 capsule by mouth daily.   Cholecalciferol (VITAMIN D3) 5000  units CAPS Take 1 capsule by mouth daily.   Coenzyme Q10 (COQ10 PO) Take 1 capsule by mouth daily.   Omega 3 1200 MG CAPS Take 1 capsule by mouth daily.   rosuvastatin (CRESTOR) 10 MG tablet Take 1 tablet (10 mg total) by mouth daily.   mupirocin ointment (BACTROBAN) 2 % Apply 1 Application topically 2 (two) times daily.   No facility-administered encounter medications on file as of 01/15/2023.    Allergies (verified) Paxil [paroxetine] and Tyloxapol   History: Past Medical History:  Diagnosis Date   Allergy    to medication   Arthritis    knee    Depression    Fainting spell    High cholesterol    Hypercholesterolemia    Hyperlipidemia    Phreesia 09/10/2019   Hypertension    Mixed hyperlipidemia    Postmenopausal    Pure hypercholesterolemia    Thyroid nodule    Tobacco abuse    Past Surgical History:  Procedure Laterality Date   CESAREAN SECTION  1992   Family History  Problem Relation Age of Onset   Hyperlipidemia Mother    Hypertension Mother    Stroke Mother 61   Hypertension Father    Hypertension Sister    Acute myelogenous leukemia Sister    Breast cancer Maternal Aunt        in her 27's   Heart attack Paternal Aunt    Lung cancer Paternal Uncle    Heart attack Paternal Uncle    Social History   Socioeconomic History   Marital  status: Married    Spouse name: Harvie Heck   Number of children: 2   Years of education: Associates Degree   Highest education level: Associate degree: occupational, Scientist, product/process development, or vocational program  Occupational History   Occupation: retired  Tobacco Use   Smoking status: Every Day    Current packs/day: 1.00    Average packs/day: 1 pack/day for 40.0 years (40.0 ttl pk-yrs)    Types: Cigarettes   Smokeless tobacco: Never   Tobacco comments:    Patient has not tried Chantix or Equip  Vaping Use   Vaping status: Never Used  Substance and Sexual Activity   Alcohol use: Yes    Comment: rarely   Drug use: No   Sexual  activity: Yes    Partners: Male    Birth control/protection: Post-menopausal    Comment: husband's health  Other Topics Concern   Not on file  Social History Narrative   09/14/19   From: the area   Living: with husband, Harvie Heck (1992)   Work: retired from Social research officer, government       Family: Savannah Mendez and stepson - Raimee Roethlisberger - one step grandson      Enjoys: gardening      Exercise: gardening - a few times a week   Diet: more meat than she wants - has a hard time with husband's taste      Safety   Seat belts: Yes    Guns: Yes  and secure   Safe in relationships: Yes    Social Determinants of Health   Financial Resource Strain: Low Risk  (01/15/2023)   Overall Financial Resource Strain (CARDIA)    Difficulty of Paying Living Expenses: Not hard at all  Food Insecurity: No Food Insecurity (01/15/2023)   Hunger Vital Sign    Worried About Running Out of Food in the Last Year: Never true    Ran Out of Food in the Last Year: Never true  Transportation Needs: No Transportation Needs (01/15/2023)   PRAPARE - Administrator, Civil Service (Medical): No    Lack of Transportation (Non-Medical): No  Physical Activity: Insufficiently Active (01/15/2023)   Exercise Vital Sign    Days of Exercise per Week: 3 days    Minutes of Exercise per Session: 30 min  Stress: No Stress Concern Present (01/15/2023)   [employer] of Occupational Health - Occupational Stress Questionnaire    Feeling of Stress : Not at all  Social Connections: Socially Integrated (01/15/2023)   Social Connection and Isolation Panel [NHANES]    Frequency of Communication with Friends and Family: More than three times a week    Frequency of Social Gatherings with Friends and Family: More than three times a week    Attends Religious Services: 1 to 4 times per year    Active Member of [employer] or Organizations: Yes    Attends Engineer, structural: More than 4 times per year    Marital Status:  Married    Tobacco Counseling Ready to quit: No Counseling given: Not Answered Tobacco comments: Patient has not tried Chantix or Equip   Clinical Intake:  Pre-visit preparation completed: Yes  Pain : No/denies pain     Nutritional Risks: None Diabetes: No  How often do you need to have someone help you when you read instructions, pamphlets, or other written materials from your doctor or pharmacy?: 1 - Never  Interpreter Needed?: No  Information entered by :: Renie Ora, LPN   Activities of Daily  Living    01/15/2023    1:09 PM  In your present state of health, do you have any difficulty performing the following activities:  Hearing? 0  Vision? 0  Difficulty concentrating or making decisions? 0  Walking or climbing stairs? 0  Dressing or bathing? 0  Doing errands, shopping? 0  Preparing Food and eating ? N  Using the Toilet? N  In the past six months, have you accidently leaked urine? N  Do you have problems with loss of bowel control? N  Managing your Medications? N  Managing your Finances? N  Housekeeping or managing your Housekeeping? N    Patient Care Team: Mort Sawyers, FNP as PCP - General (Family Medicine) Elmon Else, MD as Consulting Physician (Dermatology) Elise Benne, MD as Consulting Physician (Ophthalmology)  Indicate any recent Medical Services you may have received from other than [facility] providers in the past year (date may be approximate).     Assessment:   This is a routine wellness examination for [place].  Hearing/Vision screen Vision Screening - Comments:: Wears rx glasses - up to date with routine eye exams with  Dr.gould    Goals Addressed             This Visit's Progress    Exercise 3x per week (30 min per time)   On track    Maintain current activity level       Depression Screen    01/15/2023    1:07 PM 07/29/2022   10:05 AM 07/18/2022    9:10 AM 01/14/2022   10:18 AM 01/09/2021   11:47 AM 01/09/2021   11:09  AM 03/15/2020   11:46 AM  PHQ 2/9 Scores  PHQ - 2 Score 0 1 2 0 0 0 0  PHQ- 9 Score  4 5        Fall Risk    01/15/2023    1:05 PM 07/29/2022   10:05 AM 07/18/2022    9:09 AM 01/14/2022   10:18 AM 01/09/2021   10:44 AM  Fall Risk   Falls in the past year? 0 0 0 0 0  Number falls in past yr: 0 0 0 0 0  Injury with Fall? 0 0 0 0   Risk for fall due to : No Fall Risks   Medication side effect   Follow up Falls prevention discussed Falls evaluation completed;Education provided;Falls prevention discussed Falls evaluation completed;Education provided;Falls prevention discussed Falls prevention discussed;Education provided;Falls evaluation completed     MEDICARE RISK AT HOME: Medicare Risk at Home Any stairs in or around the home?: Yes If so, are there any without handrails?: No Home free of loose throw rugs in walkways, pet beds, electrical cords, etc?: Yes Adequate lighting in your home to reduce risk of falls?: Yes Life alert?: No Use of a cane, walker or w/c?: No Grab bars in the bathroom?: Yes Shower chair or bench in shower?: Yes Elevated toilet seat or a handicapped toilet?: Yes  TIMED UP AND GO:  Was the test performed?  No    Cognitive Function:        01/15/2023    1:09 PM 01/14/2022   10:21 AM  6CIT Screen  What Year? 0 points 0 points  What month? 0 points 0 points  What time? 0 points 0 points  Count back from 20 0 points 0 points  Months in reverse 0 points 0 points  Repeat phrase 0 points 0 points  Total Score 0 points 0 points  Immunizations Immunization History  Administered Date(s) Administered   PFIZER(Purple Top)SARS-COV-2 Vaccination 06/03/2019, 06/28/2019   Pneumococcal Conjugate-13 03/15/2020   Tdap 02/18/2008, 09/09/2018    TDAP status: Up to date  Flu Vaccine status: Due, Education has been provided regarding the importance of this vaccine. Advised may receive this vaccine at local pharmacy or Health Dept. Aware to provide a copy of  the vaccination record if obtained from local pharmacy or Health Dept. Verbalized acceptance and understanding.  Pneumococcal vaccine status: Up to date  Covid-19 vaccine status: Completed vaccines  Qualifies for Shingles Vaccine? Yes   Zostavax completed No   Shingrix Completed?: No.    Education has been provided regarding the importance of this vaccine. Patient has been advised to call insurance company to determine out of pocket expense if they have not yet received this vaccine. Advised may also receive vaccine at local pharmacy or Health Dept. Verbalized acceptance and understanding.  Screening Tests Health Maintenance  Topic Date Due   Zoster Vaccines- Shingrix (1 of 2) Never done   Lung Cancer Screening  Never done   INFLUENZA VACCINE  Never done   Pneumonia Vaccine 60+ Years old (2 of 2 - PPSV23 or PCV20) 01/22/2023 (Originally 05/10/2020)   COVID-19 Vaccine (3 - Pfizer risk series) 07/29/2023 (Originally 07/26/2019)   Medicare Annual Wellness (AWV)  01/15/2024   MAMMOGRAM  10/14/2024   DTaP/Tdap/Td (3 - Td or Tdap) 09/08/2028   Colonoscopy  04/26/2030   DEXA SCAN  Completed   Hepatitis C Screening  Completed   HPV VACCINES  Aged Out    Health Maintenance  Health Maintenance Due  Topic Date Due   Zoster Vaccines- Shingrix (1 of 2) Never done   Lung Cancer Screening  Never done   INFLUENZA VACCINE  Never done    Colorectal cancer screening: Type of screening: Colonoscopy. Completed 04/26/2020. Repeat every 10 years  Mammogram status: Completed 10/15/2022. Repeat every year  Bone Density status: Ordered declined will discuss with PCP . Pt provided with contact info and advised to call to schedule appt.  Lung Cancer Screening: (Low Dose CT Chest recommended if Age 91-80 years, 20 pack-year currently smoking OR have quit w/in 15years.) does qualify.   Lung Cancer Screening Referral: referral 01/15/2023  Additional Screening:  Hepatitis C Screening: does not qualify;  Completed 09/13/2019  Vision Screening: Recommended annual ophthalmology exams for early detection of glaucoma and other disorders of the eye. Is the patient up to date with their annual eye exam?  Yes  Who is the provider or what is the name of the office in which the patient attends annual eye exams? Dr Emily Filbert  If pt is not established with a provider, would they like to be referred to a provider to establish care? No .   Dental Screening: Recommended annual dental exams for proper oral hygiene   Community Resource Referral / Chronic Care Management: CRR required this visit?  No   CCM required this visit?  No     Plan:     I have personally reviewed and noted the following in the patient's chart:   Medical and social history Use of alcohol, tobacco or illicit drugs  Current medications and supplements including opioid prescriptions. Patient is not currently taking opioid prescriptions. Functional ability and status Nutritional status Physical activity Advanced directives List of other physicians Hospitalizations, surgeries, and ER visits in previous 12 months Vitals Screenings to include cognitive, depression, and falls Referrals and appointments  In addition, I have reviewed  and discussed with patient certain preventive protocols, quality metrics, and best practice recommendations. A written personalized care plan for preventive services as well as general preventive health recommendations were provided to patient.     Lorrene Reid, LPN   [id number]   After Visit Summary: (MyChart) Due to this being a telephonic visit, the after visit summary with patients personalized plan was offered to patient via MyChart   Nurse Notes: none

## 2023-01-17 DIAGNOSIS — H1789 Other corneal scars and opacities: Secondary | ICD-10-CM | POA: Diagnosis not present

## 2023-01-17 DIAGNOSIS — H5203 Hypermetropia, bilateral: Secondary | ICD-10-CM | POA: Diagnosis not present

## 2023-01-23 DIAGNOSIS — D1722 Benign lipomatous neoplasm of skin and subcutaneous tissue of left arm: Secondary | ICD-10-CM | POA: Diagnosis not present

## 2023-01-23 DIAGNOSIS — D225 Melanocytic nevi of trunk: Secondary | ICD-10-CM | POA: Diagnosis not present

## 2023-01-23 DIAGNOSIS — D224 Melanocytic nevi of scalp and neck: Secondary | ICD-10-CM | POA: Diagnosis not present

## 2023-01-23 DIAGNOSIS — Z85828 Personal history of other malignant neoplasm of skin: Secondary | ICD-10-CM | POA: Diagnosis not present

## 2023-01-23 DIAGNOSIS — L821 Other seborrheic keratosis: Secondary | ICD-10-CM | POA: Diagnosis not present

## 2023-01-23 DIAGNOSIS — L578 Other skin changes due to chronic exposure to nonionizing radiation: Secondary | ICD-10-CM | POA: Diagnosis not present

## 2023-01-29 ENCOUNTER — Ambulatory Visit (INDEPENDENT_AMBULATORY_CARE_PROVIDER_SITE_OTHER): Payer: Medicare Other | Admitting: Family

## 2023-01-29 ENCOUNTER — Encounter: Payer: Self-pay | Admitting: Family

## 2023-01-29 VITALS — BP 128/88 | HR 78 | Temp 97.9°F | Ht 70.0 in | Wt 144.2 lb

## 2023-01-29 DIAGNOSIS — Z720 Tobacco use: Secondary | ICD-10-CM

## 2023-01-29 DIAGNOSIS — R5383 Other fatigue: Secondary | ICD-10-CM

## 2023-01-29 DIAGNOSIS — Z0000 Encounter for general adult medical examination without abnormal findings: Secondary | ICD-10-CM | POA: Diagnosis not present

## 2023-01-29 DIAGNOSIS — E559 Vitamin D deficiency, unspecified: Secondary | ICD-10-CM

## 2023-01-29 DIAGNOSIS — E042 Nontoxic multinodular goiter: Secondary | ICD-10-CM | POA: Diagnosis not present

## 2023-01-29 DIAGNOSIS — E782 Mixed hyperlipidemia: Secondary | ICD-10-CM | POA: Diagnosis not present

## 2023-01-29 DIAGNOSIS — K117 Disturbances of salivary secretion: Secondary | ICD-10-CM

## 2023-01-29 DIAGNOSIS — E049 Nontoxic goiter, unspecified: Secondary | ICD-10-CM | POA: Diagnosis not present

## 2023-01-29 DIAGNOSIS — R7303 Prediabetes: Secondary | ICD-10-CM | POA: Diagnosis not present

## 2023-01-29 DIAGNOSIS — H18509 Unspecified hereditary corneal dystrophies, unspecified eye: Secondary | ICD-10-CM | POA: Insufficient documentation

## 2023-01-29 DIAGNOSIS — Z131 Encounter for screening for diabetes mellitus: Secondary | ICD-10-CM | POA: Diagnosis not present

## 2023-01-29 DIAGNOSIS — I10 Essential (primary) hypertension: Secondary | ICD-10-CM

## 2023-01-29 LAB — HEMOGLOBIN A1C: Hgb A1c MFr Bld: 5.8 % (ref 4.6–6.5)

## 2023-01-29 LAB — LIPID PANEL
Cholesterol: 198 mg/dL (ref 0–200)
HDL: 60.6 mg/dL (ref >39.00)
LDL Cholesterol: 121 mg/dL — ABNORMAL HIGH (ref 0–99)
NonHDL: 137.81
Total CHOL/HDL Ratio: 3
Triglycerides: 83 mg/dL (ref 0.0–149.0)
VLDL: 16.6 mg/dL (ref 0.0–40.0)

## 2023-01-29 LAB — BASIC METABOLIC PANEL
BUN: 17 mg/dL (ref 6–23)
CO2: 31 meq/L (ref 19–32)
Calcium: 9.6 mg/dL (ref 8.4–10.5)
Chloride: 101 meq/L (ref 96–112)
Creatinine, Ser: 0.74 mg/dL (ref 0.40–1.20)
GFR: 83.22 mL/min (ref >60.00)
Glucose, Bld: 83 mg/dL (ref 70–99)
Potassium: 3.9 meq/L (ref 3.5–5.1)
Sodium: 140 meq/L (ref 135–145)

## 2023-01-29 LAB — VITAMIN D 25 HYDROXY (VIT D DEFICIENCY, FRACTURES): VITD: 66.27 ng/mL (ref 30.00–100.00)

## 2023-01-29 LAB — VITAMIN B12: Vitamin B-12: 611 pg/mL (ref 211–911)

## 2023-01-29 LAB — TSH: TSH: 1.52 u[IU]/mL (ref 0.35–5.50)

## 2023-01-29 LAB — T3, FREE: T3, Free: 3.8 pg/mL (ref 2.3–4.2)

## 2023-01-29 LAB — T4, FREE: Free T4: 0.84 ng/dL (ref 0.60–1.60)

## 2023-01-29 NOTE — Assessment & Plan Note (Signed)
Smoking cessation instruction/counseling given:  counseled patient on the dangers of tobacco use, advised patient to stop smoking, and reviewed strategies to maximize success 

## 2023-01-29 NOTE — Assessment & Plan Note (Signed)
Reviewed thyroid u/s 2013 recommending continued surveillance Will order thyroid panel , pt asymptomatic at current and repeat u/s thyroid. Order placed.

## 2023-01-29 NOTE — Assessment & Plan Note (Signed)
Ordered vitamin d pending results.   

## 2023-01-29 NOTE — Assessment & Plan Note (Signed)
 Cont f/u with ophthalmology as scheduled.

## 2023-01-29 NOTE — Assessment & Plan Note (Signed)
Recommendations for biotene at night time.  Did suggest pt work on decreasing cigarette use.

## 2023-01-29 NOTE — Assessment & Plan Note (Signed)
Pt advised of the following: Work on a diabetic diet, try to incorporate exercise at least 20-30 a day for 3 days a week or more.   

## 2023-01-29 NOTE — Assessment & Plan Note (Signed)

## 2023-01-29 NOTE — Progress Notes (Signed)
Subjective:  Patient ID: Savannah Mendez, female    DOB: December 20, 1955  Age: 67 y.o. MRN: [id number]  Patient Care Team: Mort Sawyers, FNP as PCP - General (Family Medicine) Elmon Else, MD as Consulting Physician (Dermatology) Elise Benne, MD as Consulting Physician (Ophthalmology)   CC:  Chief Complaint  Patient presents with   Annual Exam    HPI Savannah Mendez is a 67 y.o. female who presents today for an annual physical exam. She reports consuming a general diet. The patient does not participate in regular exercise at present. She generally feels well. She reports sleeping poorly she wishes she could sleep a little better at night time on average she sleeps 6 hours on average but wakes up often to pee. She does not have additional problems to discuss today.   Vision:Within last year Dental:Receives regular dental care  Mammogram: 10/15/22 Last pap: > 42 y/o  Colonoscopy: cologuard 04/26/20 Bone density scan: 10/16/2011 refuses bone density scan, doesn't want to start any medications.  Pneumonia vaccination: wants to think about getting this would be due for the prevnar 20  Shingles vaccination:   Pt is with acute concerns.  Does c/o of dry mouth at night,  mainly for the last six months. At times will wake up because of it. She is a smoker.   Increased stress over the last few months which has made it harder for her to quit as well. She does have some good support systems in place.   Tobacco use, trying to quit, very hard for her but wants to try to quit cold [place]. Is going work on this. 40 pack year smoker. Referral placed for lung screening, has not yet heard from them she states to get scheduled.   Advanced Directives Patient does not have advanced directives  DEPRESSION SCREENING    01/15/2023    1:07 PM 07/29/2022   10:05 AM 07/18/2022    9:10 AM 01/14/2022   10:18 AM 01/09/2021   11:47 AM 01/09/2021   11:09 AM 03/15/2020   11:46 AM  PHQ 2/9 Scores  PHQ - 2  Score 0 1 2 0 0 0 0  PHQ- 9 Score  4 5         ROS: Negative unless specifically indicated above in HPI.    Current Outpatient Medications:    amlodipine-benazepril (LOTREL) 2.5-10 MG capsule, Take 1 capsule by mouth daily., Disp: 90 capsule, Rfl: 2   Cholecalciferol (VITAMIN D3) 5000 units CAPS, Take 1 capsule by mouth daily., Disp: , Rfl:    Coenzyme Q10 (COQ10 PO), Take 1 capsule by mouth daily., Disp: , Rfl:    Omega 3 1200 MG CAPS, Take 1 capsule by mouth daily., Disp: , Rfl:    rosuvastatin (CRESTOR) 10 MG tablet, Take 1 tablet (10 mg total) by mouth daily., Disp: 90 tablet, Rfl: 2    Objective:    BP 128/88 (BP Location: Left Arm, Patient Position: Sitting, Cuff Size: Normal)   Pulse 78   Temp 97.9 F (36.6 C) (Temporal)   Ht 5\' 10"  (1.778 m)   Wt 144 lb 3.2 oz (65.4 kg)   SpO2 98%   BMI 20.69 kg/m   BP Readings from Last 3 Encounters:  01/29/23 128/88  07/29/22 118/70  07/18/22 122/68      Physical Exam Constitutional:      General: She is not in acute distress.    Appearance: Normal appearance. She is normal weight. She is not ill-appearing.  HENT:  Head: Normocephalic.     Right Ear: Tympanic membrane normal.     Left Ear: Tympanic membrane normal.     Nose: Nose normal.     Mouth/Throat:     Mouth: Mucous membranes are moist.     Pharynx: Postnasal drip present.  Eyes:     Extraocular Movements: Extraocular movements intact.     Pupils: Pupils are equal, round, and reactive to light.  Neck:     Thyroid: Thyromegaly present. No thyroid tenderness.  Cardiovascular:     Rate and Rhythm: Normal rate and regular rhythm.  Pulmonary:     Effort: Pulmonary effort is normal.     Breath sounds: Normal breath sounds.  Abdominal:     General: Abdomen is flat. Bowel sounds are normal.     Palpations: Abdomen is soft.     Tenderness: There is no guarding or rebound.  Musculoskeletal:        General: Normal range of motion.     Cervical back: Normal range  of motion.  Skin:    General: Skin is warm.     Capillary Refill: Capillary refill takes less than 2 seconds.  Neurological:     General: No focal deficit present.     Mental Status: She is alert.  Psychiatric:        Mood and Affect: Mood normal.        Behavior: Behavior normal.        Thought Content: Thought content normal.        Judgment: Judgment normal.          Assessment & Plan:  Xerostomia Assessment & Plan: Recommendations for biotene at night time.  Did suggest pt work on decreasing cigarette use.    Tobacco abuse Assessment & Plan: Smoking cessation instruction/counseling given:  counseled patient on the dangers of tobacco use, advised patient to stop smoking, and reviewed strategies to maximize success     Prediabetes Assessment & Plan: Pt advised of the following: Work on a diabetic diet, try to incorporate exercise at least 20-30 a day for 3 days a week or more.     Corneal dystrophy Assessment & Plan: Cont f/u with ophthalmology as scheduled.   Vitamin D deficiency Assessment & Plan: Ordered vitamin d pending results.    Orders: -     VITAMIN D 25 Hydroxy (Vit-D Deficiency, Fractures)  Other fatigue -     Vitamin B12  Encounter for general adult medical examination without abnormal findings Assessment & Plan: Patient Counseling(The following topics were reviewed):  Preventative care handout given to pt  Health maintenance and immunizations reviewed. Please refer to Health maintenance section. Pt advised on safe sex, wearing seatbelts in car, and proper nutrition labwork ordered today for annual Dental health: Discussed importance of regular tooth brushing, flossing, and dental visits.   Orders: -     Hemoglobin A1c -     Lipid panel -     Basic metabolic panel -     VITAMIN D 25 Hydroxy (Vit-D Deficiency, Fractures) -     Vitamin B12  Primary hypertension  Mixed hyperlipidemia -     Lipid panel  Multinodular  thyroid Assessment & Plan: Reviewed thyroid u/s 2013 recommending continued surveillance Will order thyroid panel , pt asymptomatic at current and repeat u/s thyroid. Order placed.  Orders: -     Thyroid Peroxidase Antibodies (TPO) (REFL) -     T3, free -     T4, free -  TSH  Goiter -     US THYROID; Future      Follow-up: Return in about 1 year (around 01/29/2024) for f/u CPE.   Mort Sawyers, FNP

## 2023-01-29 NOTE — Patient Instructions (Signed)
   Your imaging for transvaginal ultrasound of your pelvis Has been ordered at the following location.  Please call to schedule a time and date that would work for you.    [address] [place] Phone [phone],  8-430 pm      Regards,   Eugenia Pancoast FNP-C

## 2023-01-30 ENCOUNTER — Telehealth: Payer: Self-pay | Admitting: Family

## 2023-01-30 ENCOUNTER — Other Ambulatory Visit: Payer: Self-pay

## 2023-01-30 DIAGNOSIS — Z122 Encounter for screening for malignant neoplasm of respiratory organs: Secondary | ICD-10-CM

## 2023-01-30 DIAGNOSIS — Z87891 Personal history of nicotine dependence: Secondary | ICD-10-CM

## 2023-01-30 DIAGNOSIS — F17210 Nicotine dependence, cigarettes, uncomplicated: Secondary | ICD-10-CM

## 2023-01-30 NOTE — Telephone Encounter (Signed)
Called Kiany- spoke to her and provided the Lung Cancer Screening Program scheduler's direct phone [phone] to contact to initiate the process. Also let her know the dept is within [facility] and I sent an in-basket message to them as well. Pt repeated back to me the information and said she will call them directly.

## 2023-01-31 ENCOUNTER — Ambulatory Visit
Admission: RE | Admit: 2023-01-31 | Discharge: 2023-01-31 | Disposition: A | Discharge status: Home / Self Care | Payer: Medicare Other | Source: Ambulatory Visit | Attending: Family | Admitting: Family

## 2023-01-31 DIAGNOSIS — E049 Nontoxic goiter, unspecified: Secondary | ICD-10-CM

## 2023-01-31 DIAGNOSIS — E041 Nontoxic single thyroid nodule: Secondary | ICD-10-CM | POA: Diagnosis not present

## 2023-01-31 LAB — THYROID PEROXIDASE ANTIBODIES (TPO) (REFL): Thyroperoxidase Ab SerPl-aCnc: 31 [IU]/mL — ABNORMAL HIGH (ref <9)

## 2023-02-03 ENCOUNTER — Ambulatory Visit (INDEPENDENT_AMBULATORY_CARE_PROVIDER_SITE_OTHER): Payer: Medicare Other | Admitting: Adult Health

## 2023-02-03 ENCOUNTER — Encounter: Payer: Self-pay | Admitting: Adult Health

## 2023-02-03 DIAGNOSIS — F17210 Nicotine dependence, cigarettes, uncomplicated: Secondary | ICD-10-CM | POA: Diagnosis not present

## 2023-02-03 NOTE — Patient Instructions (Signed)

## 2023-02-03 NOTE — Progress Notes (Signed)
  Virtual Visit via Telephone Note  I connected with Savannah Mendez , 02/03/23 9:50 AM by a telemedicine application and verified that I am speaking with the correct person using two identifiers.  Location: Patient: home Provider: home   I discussed the limitations of evaluation and management by telemedicine and the availability of in person appointments. The patient expressed understanding and agreed to proceed.   Shared Decision Making Visit Lung Cancer Screening Program [phone])   Eligibility: 67 y.o. Pack Years Smoking History Calculation =42 pack years (# packs/per year x # years smoked) Recent History of coughing up blood  no Unexplained weight loss? no ( >Than 15 pounds within the last 6 months ) Prior History Lung / other cancer no (Diagnosis within the last 5 years already requiring surveillance chest CT Scans). Smoking Status Current Smoker  Visit Components: Discussion included one or more decision making aids. YES Discussion included risk/benefits of screening. YES Discussion included potential follow up diagnostic testing for abnormal scans. YES Discussion included meaning and risk of over diagnosis. YES Discussion included meaning and risk of False Positives. YES Discussion included meaning of total radiation exposure. YES  Counseling Included: Importance of adherence to annual lung cancer LDCT screening. YES Impact of comorbidities on ability to participate in the program. YES Ability and willingness to under diagnostic treatment. YES  Smoking Cessation Counseling: Current Smokers:  Discussed importance of smoking cessation. yes Information about tobacco cessation classes and interventions provided to patient. yes Patient provided with "ticket" for LDCT Scan. yes Symptomatic Patient. no Diagnosis Code: Tobacco Use Z72.0 Asymptomatic Patient yes  Counseling (Intermediate counseling: > three minutes counseling) G2952 Written Order for Lung Cancer Screening  with LDCT placed in Epic. Yes (CT Chest Lung Cancer Screening Low Dose W/O CM) [id number]  Z12.2-Screening of respiratory organs Z87.891-Personal history of nicotine dependence   Danford Bad 02/03/23

## 2023-02-12 ENCOUNTER — Ambulatory Visit
Admission: RE | Admit: 2023-02-12 | Discharge: 2023-02-12 | Disposition: A | Discharge status: Home / Self Care | Payer: Medicare Other | Source: Ambulatory Visit | Attending: Family | Admitting: Family

## 2023-02-12 DIAGNOSIS — Z122 Encounter for screening for malignant neoplasm of respiratory organs: Secondary | ICD-10-CM

## 2023-02-12 DIAGNOSIS — Z87891 Personal history of nicotine dependence: Secondary | ICD-10-CM

## 2023-02-12 DIAGNOSIS — F17210 Nicotine dependence, cigarettes, uncomplicated: Secondary | ICD-10-CM | POA: Diagnosis not present

## 2023-02-28 ENCOUNTER — Other Ambulatory Visit: Payer: Self-pay | Admitting: Family

## 2023-02-28 DIAGNOSIS — E785 Hyperlipidemia, unspecified: Secondary | ICD-10-CM

## 2023-02-28 DIAGNOSIS — I10 Essential (primary) hypertension: Secondary | ICD-10-CM

## 2023-03-06 ENCOUNTER — Other Ambulatory Visit: Payer: Self-pay | Admitting: Acute Care

## 2023-03-06 DIAGNOSIS — Z122 Encounter for screening for malignant neoplasm of respiratory organs: Secondary | ICD-10-CM

## 2023-03-06 DIAGNOSIS — F17210 Nicotine dependence, cigarettes, uncomplicated: Secondary | ICD-10-CM

## 2023-03-06 DIAGNOSIS — Z87891 Personal history of nicotine dependence: Secondary | ICD-10-CM

## 2023-03-31 ENCOUNTER — Encounter: Payer: Self-pay | Admitting: Family

## 2023-03-31 DIAGNOSIS — I700 Atherosclerosis of aorta: Secondary | ICD-10-CM | POA: Insufficient documentation

## 2023-08-21 DIAGNOSIS — L720 Epidermal cyst: Secondary | ICD-10-CM | POA: Diagnosis not present

## 2023-08-21 DIAGNOSIS — D224 Melanocytic nevi of scalp and neck: Secondary | ICD-10-CM | POA: Diagnosis not present

## 2023-08-21 DIAGNOSIS — Z85828 Personal history of other malignant neoplasm of skin: Secondary | ICD-10-CM | POA: Diagnosis not present

## 2023-08-21 DIAGNOSIS — D225 Melanocytic nevi of trunk: Secondary | ICD-10-CM | POA: Diagnosis not present

## 2023-08-21 DIAGNOSIS — L821 Other seborrheic keratosis: Secondary | ICD-10-CM | POA: Diagnosis not present

## 2023-08-21 DIAGNOSIS — D2272 Melanocytic nevi of left lower limb, including hip: Secondary | ICD-10-CM | POA: Diagnosis not present

## 2023-08-21 DIAGNOSIS — Z86018 Personal history of other benign neoplasm: Secondary | ICD-10-CM | POA: Diagnosis not present

## 2023-08-21 DIAGNOSIS — L578 Other skin changes due to chronic exposure to nonionizing radiation: Secondary | ICD-10-CM | POA: Diagnosis not present

## 2023-08-21 DIAGNOSIS — D1722 Benign lipomatous neoplasm of skin and subcutaneous tissue of left arm: Secondary | ICD-10-CM | POA: Diagnosis not present

## 2023-10-28 ENCOUNTER — Telehealth: Payer: Self-pay | Admitting: Family

## 2023-10-28 DIAGNOSIS — Z1231 Encounter for screening mammogram for malignant neoplasm of breast: Secondary | ICD-10-CM

## 2023-10-28 NOTE — Telephone Encounter (Signed)
 Copied from CRM [id number]. Topic: General - Other >> Oct 28, 2023  9:13 AM Avram MATSU wrote: Reason for CRM: Patient is calling because she needs a diagnostic breast exam & US  and the order needs to be sent in to [facility] in [place]. Patient stated they sent in a request before and has been waiting. Her husband has an up coming surgery and need this order to be put in soon if possible. She already has an appt scheduled for the 8/14   Please contact pt with more information [phone] (M)

## 2023-10-28 NOTE — Addendum Note (Signed)
 Addended by: ALBINO SHAVER C on: 10/28/2023 11:09 AM   Modules accepted: Orders

## 2023-10-28 NOTE — Telephone Encounter (Signed)
 Copied from CRM [id number]. Topic: General - Other >> Oct 28, 2023  9:13 AM Avram MATSU wrote: Reason for CRM: Patient is calling because she needs a diagnostic breast exam & US  and the order needs to be sent in to [facility] in [place]. Patient stated they sent in a request before and has been waiting. Her husband has an up coming surgery and need this order to be put in soon if possible. She already has an appt scheduled for the 8/14   Please contact pt with more information [phone] (M)

## 2023-10-28 NOTE — Telephone Encounter (Signed)
 Order has been placed for mammogram and faxed to [facility]. Pt has been made aware.

## 2023-10-29 ENCOUNTER — Telehealth: Payer: Self-pay | Admitting: Family

## 2023-10-29 NOTE — Telephone Encounter (Signed)
 Copied from CRM [id number]. Topic: General - Other >> Oct 29, 2023  2:38 PM Berneda FALCON wrote: Reason for CRM: Patient has an appt scheduled for tomorrow with [facility] but the order needs to be updated to reflect that there is a bilateral diagnostic mammogram with an ultrasound of her left breast based on her symptoms of feeling a lump in her breast. If this is not updated and sent back, they will have to cancel the appt.  Fax number is [phone]

## 2023-10-29 NOTE — Telephone Encounter (Signed)
 Patient came in office. She went for screening but told them she had area of concern. They rescheduled as diagnostic with u/s. Advised patient we can't change order without exam. I have scheduled for office visit 10/30/23. Called [facility] at patient request and canceled appointment for [facility].  No further action needed at this time.

## 2023-10-29 NOTE — Telephone Encounter (Signed)
 Kayla with [facility] called to advise they have not received the fax for the Mammogram and US  order. She is asking that they be faxed to [phone].

## 2023-10-29 NOTE — Telephone Encounter (Signed)
 Spoke with pt and made her aware that this has been refaxed to [place].

## 2023-10-29 NOTE — Telephone Encounter (Unsigned)
 Copied from CRM #[id number]. Topic: Clinical - Request for Lab/Test Order >> Oct 29, 2023  1:08 PM Dedra B wrote: Reason for CRM: Patient is requesting to have mammogram order faxed to [facility] [phone]. She states that they have not received the order as of this afternoon. Please call patient.

## 2023-10-29 NOTE — Telephone Encounter (Unsigned)
 Copied from CRM [id number]. Topic: General - Other >> Oct 29, 2023 10:57 AM Thersia BROCKS wrote: Reason for CRM: [facility] called in stating they will send over a form to be signed as patient as a appointment with them tomorrow

## 2023-10-29 NOTE — Telephone Encounter (Signed)
 This is a duplicate message. I have already spoken with the pt and her order has been refaxed.

## 2023-10-30 ENCOUNTER — Encounter: Payer: Self-pay | Admitting: Family

## 2023-10-30 ENCOUNTER — Ambulatory Visit (INDEPENDENT_AMBULATORY_CARE_PROVIDER_SITE_OTHER): Admitting: Family

## 2023-10-30 VITALS — BP 120/82 | HR 68 | Temp 98.4°F | Ht 70.0 in | Wt 147.0 lb

## 2023-10-30 DIAGNOSIS — R590 Localized enlarged lymph nodes: Secondary | ICD-10-CM | POA: Diagnosis not present

## 2023-10-30 DIAGNOSIS — N6322 Unspecified lump in the left breast, upper inner quadrant: Secondary | ICD-10-CM | POA: Insufficient documentation

## 2023-10-30 NOTE — Assessment & Plan Note (Signed)
 Diagnostic bil mammogram and left breast u/s  Ordering and pending

## 2023-10-30 NOTE — Progress Notes (Signed)
 a  Established Patient Office Visit  Subjective:      CC:  Chief Complaint  Patient presents with   Follow-up    Pt went for screening mammogram and told them that she felt something and was told that she would need a diagnostic mammogram.     HPI: Savannah Mendez is a 68 y.o. female presenting on 10/30/2023 for Follow-up (Pt went for screening mammogram and told them that she felt something and was told that she would need a diagnostic mammogram. ) .  Discussed the use of AI scribe software for clinical note transcription with the patient, who gave verbal consent to proceed.  History of Present Illness  Went for a screening mammogram at [facility] and she told them that there was a hardness she could palpate in her left breast. There is no breast mass no nipple discharge no nipple inversion. There is some breast tenderness.         Social history:  Relevant past medical, surgical, family and social history reviewed and updated as indicated. Interim medical history since our last visit reviewed.  Allergies and medications reviewed and updated.  DATA REVIEWED: CHART IN EPIC     ROS: Negative unless specifically indicated above in HPI.    Current Outpatient Medications:    amlodipine -benazepril  (LOTREL) 2.5-10 MG capsule, TAKE 1 CAPSULE BY MOUTH DAILY., Disp: 90 capsule, Rfl: 3   Cholecalciferol (VITAMIN D3) 5000 units CAPS, Take 1 capsule by mouth daily., Disp: , Rfl:    Coenzyme Q10 (COQ10 PO), Take 1 capsule by mouth daily., Disp: , Rfl:    Omega 3 1200 MG CAPS, Take 1 capsule by mouth daily., Disp: , Rfl:    rosuvastatin  (CRESTOR ) 10 MG tablet, TAKE 1 TABLET (10 MG TOTAL) BY MOUTH DAILY., Disp: 90 tablet, Rfl: 3        Objective:        BP 120/82 (BP Location: Left Arm, Patient Position: Sitting, Cuff Size: Normal)   Pulse 68   Temp 98.4 F (36.9 C) (Temporal)   Ht 5' 10 (1.778 m)   Wt 147 lb (66.7 kg)   SpO2 98%   BMI 21.09 kg/m   Physical  Exam   Wt Readings from Last 3 Encounters:  10/30/23 147 lb (66.7 kg)  01/29/23 144 lb 3.2 oz (65.4 kg)  01/15/23 144 lb (65.3 kg)    Physical Exam Vitals reviewed.  Constitutional:      General: She is not in acute distress.    Appearance: Normal appearance. She is normal weight. She is not ill-appearing, toxic-appearing or diaphoretic.  HENT:     Head: Normocephalic.  Cardiovascular:     Rate and Rhythm: Normal rate.  Pulmonary:     Effort: Pulmonary effort is normal.  Chest:  Breasts:    Breasts are symmetrical.     Right: No inverted nipple or mass.     Left: Mass (upper inner quadrant small tender palpable density) present. No inverted nipple.     Comments: Bil dense breasts  Musculoskeletal:        General: Normal range of motion.  Lymphadenopathy:     Upper Body:     Left upper body: Axillary adenopathy present.  Neurological:     General: No focal deficit present.     Mental Status: She is alert and oriented to person, place, and time. Mental status is at baseline.  Psychiatric:        Mood and Affect: Mood normal.  Behavior: Behavior normal.        Thought Content: Thought content normal.        Judgment: Judgment normal.          Results   Assessment & Plan:   Assessment and Plan Assessment & Plan     Mass of upper inner quadrant of left breast Assessment & Plan: Diagnostic bil mammogram and left breast u/s  Ordering and pending   Orders: -     US  BREAST COMPLETE UNI LEFT INC AXILLA; Future -     MM 3D DIAGNOSTIC MAMMOGRAM BILATERAL BREAST; Future  Axillary lymphadenopathy Assessment & Plan: Left breast ultrasound ordered and pending   Orders: -     US  BREAST COMPLETE UNI LEFT INC AXILLA; Future -     MM 3D DIAGNOSTIC MAMMOGRAM BILATERAL BREAST; Future     Return if symptoms worsen or fail to improve.     Ginger Patrick, MSN, APRN, FNP-C [facility]

## 2023-10-30 NOTE — Assessment & Plan Note (Signed)
 Left breast ultrasound ordered and pending

## 2023-12-15 DIAGNOSIS — R928 Other abnormal and inconclusive findings on diagnostic imaging of breast: Secondary | ICD-10-CM | POA: Diagnosis not present

## 2023-12-15 DIAGNOSIS — N6002 Solitary cyst of left breast: Secondary | ICD-10-CM | POA: Diagnosis not present

## 2023-12-15 LAB — HM MAMMOGRAPHY

## 2023-12-17 ENCOUNTER — Ambulatory Visit: Payer: Self-pay | Admitting: Family

## 2023-12-17 NOTE — Progress Notes (Signed)
 noted

## 2023-12-23 ENCOUNTER — Other Ambulatory Visit: Payer: Self-pay | Admitting: Radiology

## 2023-12-23 DIAGNOSIS — N6012 Diffuse cystic mastopathy of left breast: Secondary | ICD-10-CM | POA: Diagnosis not present

## 2023-12-23 DIAGNOSIS — N6321 Unspecified lump in the left breast, upper outer quadrant: Secondary | ICD-10-CM | POA: Diagnosis not present

## 2023-12-24 LAB — SURGICAL PATHOLOGY

## 2023-12-29 ENCOUNTER — Ambulatory Visit: Payer: Self-pay | Admitting: Family

## 2023-12-29 NOTE — Progress Notes (Signed)
 noted

## 2024-01-19 ENCOUNTER — Ambulatory Visit: Payer: Medicare Other

## 2024-01-19 VITALS — BP 120/82 | Ht 70.0 in | Wt 147.0 lb

## 2024-01-19 DIAGNOSIS — Z0000 Encounter for general adult medical examination without abnormal findings: Secondary | ICD-10-CM

## 2024-01-19 NOTE — Patient Instructions (Signed)
 Savannah Mendez,  Thank you for taking the time for your Medicare Wellness Visit. I appreciate your continued commitment to your health goals. Please review the care plan we discussed, and feel free to reach out if I can assist you further.  Please note that Annual Wellness Visits do not include a physical exam. Some assessments may be limited, especially if the visit was conducted virtually. If needed, we may recommend an in-person follow-up with your provider.  Ongoing Care Seeing your primary care provider every 3 to 6 months helps us  monitor your health and provide consistent, personalized care.   Referrals If a referral was made during today's visit and you haven't received any updates within two weeks, please contact the referred provider directly to check on the status.  Recommended Screenings:  Health Maintenance  Topic Date Due   Zoster (Shingles) Vaccine (1 of 2) Never done   COVID-19 Vaccine (3 - Pfizer risk series) 07/26/2019   Cologuard (Stool DNA test)  04/27/2023   Flu Shot  Never done   Medicare Annual Wellness Visit  01/15/2024   Screening for Lung Cancer  02/12/2024   Pneumococcal Vaccine for age over 2 (2 of 2 - PPSV23, PCV20, or PCV21) 01/29/2024*   Breast Cancer Screening  12/14/2025   DTaP/Tdap/Td vaccine (3 - Td or Tdap) 09/08/2028   DEXA scan (bone density measurement)  Completed   Hepatitis C Screening  Completed   Meningitis B Vaccine  Aged Out   Colon Cancer Screening  Discontinued  *Topic was postponed. The date shown is not the original due date.       01/19/2024   11:11 AM  Advanced Directives  Does Patient Have a Medical Advance Directive? No  Would patient like information on creating a medical advance directive? No - Patient declined    Vision: Annual vision screenings are recommended for early detection of glaucoma, cataracts, and diabetic retinopathy. These exams can also reveal signs of chronic conditions such as diabetes and high blood  pressure.  Dental: Annual dental screenings help detect early signs of oral cancer, gum disease, and other conditions linked to overall health, including heart disease and diabetes.  Please see the attached documents for additional preventive care recommendations.

## 2024-01-19 NOTE — Progress Notes (Addendum)
 I connected with  Savannah Mendez on 01/19/2024 by a audio enabled telemedicine application and verified that I am speaking with the correct person using two identifiers.  Patient Location: Home  Provider Location: Home Office  Persons Participating in Visit: Patient.  I discussed the limitations of evaluation and management by telemedicine. The patient expressed understanding and agreed to proceed.  Vital Signs: Because this visit was a virtual/telehealth visit, some criteria may be missing or patient reported. Any vitals not documented were not able to be obtained and vitals that have been documented are patient reported.  Because this visit was a virtual/telehealth visit,  certain criteria was not obtained, such a blood pressure, CBG if applicable, and timed get up and go. Any medications not marked as taking were not mentioned during the medication reconciliation part of the visit. Any vitals not documented were not able to be obtained due to this being a telehealth visit or patient was unable to self-report a recent blood pressure reading due to a lack of equipment at home via telehealth. Vitals that have been documented are verbally provided by the patient.  This visit was performed by a medical professional under my direct supervision. I was immediately available for consultation/collaboration. I have reviewed and agree with the Annual Wellness Visit documentation.  Subjective:   Savannah Mendez is a 68 y.o. female who presents for a Medicare Annual Wellness Visit.  Allergies (verified) Paxil [paroxetine] and Tyloxapol   History: Past Medical History:  Diagnosis Date   Allergy    to medication   Arthritis    knee    Depression    Fainting spell    High cholesterol    Hypercholesterolemia    Hyperlipidemia    Phreesia 09/10/2019   Hypertension    Mixed hyperlipidemia    Postmenopausal    Pure hypercholesterolemia    Thyroid  nodule    Tobacco abuse    Past Surgical  History:  Procedure Laterality Date   CESAREAN SECTION  1992   Family History  Problem Relation Age of Onset   Hyperlipidemia Mother    Hypertension Mother    Stroke Mother 70   Hypertension Father    Hypertension Sister    Acute myelogenous leukemia Sister    Breast cancer Maternal Aunt        in her 84's   Heart attack Paternal Aunt    Lung cancer Paternal Uncle    Heart attack Paternal Uncle    Social History   Occupational History   Occupation: retired  Tobacco Use   Smoking status: Every Day    Current packs/day: 1.00    Average packs/day: 1 pack/day for 40.0 years (40.0 ttl pk-yrs)    Types: Cigarettes   Smokeless tobacco: Never   Tobacco comments:    Patient has not tried Chantix or Equip  Vaping Use   Vaping status: Never Used  Substance and Sexual Activity   Alcohol use: Yes    Comment: rarely   Drug use: No   Sexual activity: Yes    Partners: Male    Birth control/protection: Post-menopausal    Comment: husband's health   Tobacco Counseling Ready to quit: Not Answered Counseling given: Not Answered Tobacco comments: Patient has not tried Chantix or Equip  SDOH Screenings   Food Insecurity: No Food Insecurity (01/19/2024)  Housing: Low Risk  (01/19/2024)  Transportation Needs: No Transportation Needs (01/19/2024)  Utilities: Not At Risk (01/19/2024)  Alcohol Screen: Low Risk  (01/15/2023)  Depression (PHQ2-9): Low Risk  (  01/19/2024)  Financial Resource Strain: Low Risk  (01/15/2023)  Physical Activity: Insufficiently Active (01/19/2024)  Social Connections: Moderately Integrated (01/19/2024)  Stress: No Stress Concern Present (01/19/2024)  Tobacco Use: High Risk (01/19/2024)  Health Literacy: Adequate Health Literacy (01/19/2024)   Depression Screen    01/19/2024    2:08 PM 10/30/2023    8:54 AM 01/15/2023    1:07 PM 07/29/2022   10:05 AM 07/18/2022    9:10 AM 01/14/2022   10:18 AM 01/09/2021   11:47 AM  PHQ 2/9 Scores  PHQ - 2 Score 0 0 0 1 2 0 0   PHQ- 9 Score 0 1  4 5        Goals Addressed             This Visit's Progress    Exercise 3x per week (30 min per time)   On track    Maintain current activity level       Visit info / Clinical Intake: Medicare Wellness Visit Type:: Subsequent Annual Wellness Visit Medicare Wellness Visit Mode:: Telephone If telephone:: video declined If telephone or video:: pt reported vitals Interpreter Needed?: No Pre-visit prep was completed: no AWV questionnaire completed by patient prior to visit?: yes Date:: 01/19/24 Living arrangements:: lives with spouse/significant other Patient's Overall Health Status Rating: excellent Typical amount of pain: none Does pain affect daily life?: no Are you currently prescribed opioids?: no  Dietary Habits and Nutritional Risks How many meals a day?: 2 Eats fruit and vegetables daily?: yes Most meals are obtained by: preparing own meals; eating out In the last 2 weeks, have you had any of the following?: -- (none) Diabetic:: no  Functional Status Activities of Daily Living (to include ambulation/medication): (Patient-Rptd) Independent Ambulation: (Patient-Rptd) Independent Medication Administration: Independent Home Management: (Patient-Rptd) Independent Manage your own finances?: yes Primary transportation is: driving Concerns about vision?: no *vision screening is required for WTM* Concerns about hearing?: no  Fall Screening Falls in the past year?: (Patient-Rptd) 0 Number of falls in past year: (Patient-Rptd) 0 Was there an injury with Fall?: (Patient-Rptd) 0 Fall Risk Category Calculator: (Patient-Rptd) 0 Patient Fall Risk Level: (Patient-Rptd) Low Fall Risk  Fall Risk Patient at Risk for Falls Due to: No Fall Risks Fall risk Follow up: Falls evaluation completed  Home and Transportation Safety: All rugs have non-skid backing?: yes All stairs or steps have railings?: yes Grab bars in the bathtub or shower?: (!) no Have  non-skid surface in bathtub or shower?: yes Good home lighting?: yes Regular seat belt use?: yes Hospital stays in the last year:: no  Cognitive Assessment Difficulty concentrating, remembering, or making decisions? : no Will 6CIT or Mini Cog be Completed: no 6CIT or Mini Cog Declined: patient alert, oriented, able to answer questions appropriately and recall recent events  Advance Directives (For Healthcare) Does Patient Have a Medical Advance Directive?: No Would patient like information on creating a medical advance directive?: No - Patient declined  Reviewed/Updated  Reviewed/Updated: All        Objective:    Today's Vitals   01/19/24 1406  BP: 120/82  Weight: 147 lb (66.7 kg)  Height: 5' 10 (1.778 m)   Body mass index is 21.09 kg/m.  Current Medications (verified) Outpatient Encounter Medications as of 01/19/2024  Medication Sig   amlodipine -benazepril  (LOTREL) 2.5-10 MG capsule TAKE 1 CAPSULE BY MOUTH DAILY.   Coenzyme Q10 (COQ10 PO) Take 1 capsule by mouth daily.   Omega 3 1200 MG CAPS Take 1 capsule by mouth  daily.   rosuvastatin  (CRESTOR ) 10 MG tablet TAKE 1 TABLET (10 MG TOTAL) BY MOUTH DAILY.   Cholecalciferol (VITAMIN D3) 5000 units CAPS Take 1 capsule by mouth daily.   No facility-administered encounter medications on file as of 01/19/2024.   Hearing/Vision screen Hearing Screening - Comments:: Patient has no difficulties Vision Screening - Comments:: Patient has no difficulties  Immunizations and Health Maintenance Health Maintenance  Topic Date Due   Zoster Vaccines- Shingrix (1 of 2) Never done   COVID-19 Vaccine (3 - Pfizer risk series) 07/26/2019   Fecal DNA (Cologuard)  04/27/2023   Influenza Vaccine  Never done   Lung Cancer Screening  02/12/2024   Pneumococcal Vaccine: 50+ Years (2 of 2 - PPSV23, PCV20, or PCV21) 01/29/2024 (Originally 05/10/2020)   Medicare Annual Wellness (AWV)  01/18/2025   Mammogram  12/14/2025   DTaP/Tdap/Td (3 - Td or  Tdap) 09/08/2028   DEXA SCAN  Completed   Hepatitis C Screening  Completed   Meningococcal B Vaccine  Aged Out   Colonoscopy  Discontinued        Assessment/Plan:  This is a routine wellness examination for [place].  Patient Care Team: Corwin Antu, FNP as PCP - General (Family Medicine) Robinson Pao, MD as Consulting Physician (Dermatology) Robinson Idol, MD as Consulting Physician (Ophthalmology)  I have personally reviewed and noted the following in the patient's chart:   Medical and social history Use of alcohol, tobacco or illicit drugs  Current medications and supplements including opioid prescriptions. Functional ability and status Nutritional status Physical activity Advanced directives List of other physicians Hospitalizations, surgeries, and ER visits in previous 12 months Vitals Screenings to include cognitive, depression, and falls Referrals and appointments  No orders of the defined types were placed in this encounter.  In addition, I have reviewed and discussed with patient certain preventive protocols, quality metrics, and best practice recommendations. A written personalized care plan for preventive services as well as general preventive health recommendations were provided to patient.   Lyle MARLA Right, CMA   01/19/2024   Return in 1 year (on 01/18/2025).  After Visit Summary: (MyChart) Due to this being a telephonic visit, the after visit summary with patients personalized plan was offered to patient via MyChart   Nurse Notes: nothing to report

## 2024-02-02 ENCOUNTER — Encounter: Payer: Self-pay | Admitting: Family

## 2024-02-02 ENCOUNTER — Ambulatory Visit (INDEPENDENT_AMBULATORY_CARE_PROVIDER_SITE_OTHER): Payer: Medicare Other | Admitting: Family

## 2024-02-02 VITALS — BP 138/82 | HR 68 | Temp 98.0°F | Ht 70.0 in | Wt 145.0 lb

## 2024-02-02 DIAGNOSIS — N6322 Unspecified lump in the left breast, upper inner quadrant: Secondary | ICD-10-CM

## 2024-02-02 DIAGNOSIS — Z720 Tobacco use: Secondary | ICD-10-CM | POA: Diagnosis not present

## 2024-02-02 DIAGNOSIS — E559 Vitamin D deficiency, unspecified: Secondary | ICD-10-CM | POA: Diagnosis not present

## 2024-02-02 DIAGNOSIS — R7303 Prediabetes: Secondary | ICD-10-CM

## 2024-02-02 DIAGNOSIS — E063 Autoimmune thyroiditis: Secondary | ICD-10-CM | POA: Diagnosis not present

## 2024-02-02 DIAGNOSIS — E7800 Pure hypercholesterolemia, unspecified: Secondary | ICD-10-CM

## 2024-02-02 DIAGNOSIS — Z1211 Encounter for screening for malignant neoplasm of colon: Secondary | ICD-10-CM

## 2024-02-02 DIAGNOSIS — E042 Nontoxic multinodular goiter: Secondary | ICD-10-CM

## 2024-02-02 DIAGNOSIS — Z0000 Encounter for general adult medical examination without abnormal findings: Secondary | ICD-10-CM

## 2024-02-02 DIAGNOSIS — E782 Mixed hyperlipidemia: Secondary | ICD-10-CM | POA: Diagnosis not present

## 2024-02-02 DIAGNOSIS — I10 Essential (primary) hypertension: Secondary | ICD-10-CM

## 2024-02-02 DIAGNOSIS — K5520 Angiodysplasia of colon without hemorrhage: Secondary | ICD-10-CM

## 2024-02-02 LAB — COMPREHENSIVE METABOLIC PANEL WITH GFR
ALT: 12 U/L (ref 0–35)
AST: 14 U/L (ref 0–37)
Albumin: 4.4 g/dL (ref 3.5–5.2)
Alkaline Phosphatase: 61 U/L (ref 39–117)
BUN: 16 mg/dL (ref 6–23)
CO2: 29 meq/L (ref 19–32)
Calcium: 9.3 mg/dL (ref 8.4–10.5)
Chloride: 102 meq/L (ref 96–112)
Creatinine, Ser: 0.71 mg/dL (ref 0.40–1.20)
GFR: 86.84 mL/min (ref >60.00)
Glucose, Bld: 87 mg/dL (ref 70–99)
Potassium: 3.9 meq/L (ref 3.5–5.1)
Sodium: 141 meq/L (ref 135–145)
Total Bilirubin: 0.6 mg/dL (ref 0.2–1.2)
Total Protein: 6.6 g/dL (ref 6.0–8.3)

## 2024-02-02 LAB — MICROALBUMIN / CREATININE URINE RATIO
Creatinine,U: 164.1 mg/dL
Microalb Creat Ratio: 8.7 mg/g (ref 0.0–30.0)
Microalb, Ur: 1.4 mg/dL (ref 0.0–1.9)

## 2024-02-02 LAB — LIPID PANEL
Cholesterol: 168 mg/dL (ref 0–200)
HDL: 58.9 mg/dL (ref >39.00)
LDL Cholesterol: 94 mg/dL (ref 0–99)
NonHDL: 108.7
Total CHOL/HDL Ratio: 3
Triglycerides: 73 mg/dL (ref 0.0–149.0)
VLDL: 14.6 mg/dL (ref 0.0–40.0)

## 2024-02-02 LAB — CBC
HCT: 43.9 % (ref 36.0–46.0)
Hemoglobin: 14.8 g/dL (ref 12.0–15.0)
MCHC: 33.6 g/dL (ref 30.0–36.0)
MCV: 91.9 fl (ref 78.0–100.0)
Platelets: 173 K/uL (ref 150.0–400.0)
RBC: 4.78 Mil/uL (ref 3.87–5.11)
RDW: 13.1 % (ref 11.5–15.5)
WBC: 4.5 K/uL (ref 4.0–10.5)

## 2024-02-02 LAB — VITAMIN D 25 HYDROXY (VIT D DEFICIENCY, FRACTURES): VITD: 45.93 ng/mL (ref 30.00–100.00)

## 2024-02-02 LAB — TSH: TSH: 1.54 u[IU]/mL (ref 0.35–5.50)

## 2024-02-02 MED ORDER — NICOTINE 21 MG/24HR TD PT24: 21.0000 mg | MEDICATED_PATCH | Freq: Every day | TRANSDERMAL | 0 refills | Status: AC

## 2024-02-02 NOTE — Progress Notes (Signed)
 Subjective:  Patient ID: Savannah Mendez, female    DOB: 1955/11/24  Age: 68 y.o. MRN: [id number]  Patient Care Team: Corwin Antu, FNP as PCP - General (Family Medicine) Robinson Pao, MD as Consulting Physician (Dermatology) Robinson Idol, MD as Consulting Physician (Ophthalmology)   CC:  Chief Complaint  Patient presents with   Annual Exam    HPI Savannah Mendez is a 68 y.o. female who presents today for an annual physical exam. She reports consuming a general diet. Active out in the yard, gardens often She generally feels well. She reports sleeping poorly. She does have additional problems to discuss today.   Vision:Within last year Dental:Receives regular dental care  Lung Cancer Screening with low-dose Chest CT: lung screening program 02/18/24 scheduled   Mammogram: 12/15/23 Last pap: > 40 y/o  Colonoscopy: cologuard 04/26/20 Bone density scan:  Pt is with acute concerns.   Discussed the use of AI scribe software for clinical note transcription with the patient, who gave verbal consent to proceed.  History of Present Illness [name] is a 68 year old female who presents for an annual physical exam.  She experiences difficulty adjusting to the end of daylight savings time, which affects her sleep. She typically sleeps about six to six and a half hours per night, which is less than she used to. She falls asleep quickly but has trouble staying asleep, often waking up once to urinate. She has tried melatonin in the past without significant improvement.  She is currently taking amlodipine  and benazepril  for blood pressure and a cholesterol medication at night. She inquired if these medications could be causing her sleep issues, particularly if they contain a diuretic, but they do not.  She underwent a diagnostic mammogram and ultrasound-guided biopsy recently, which showed no signs of cancer. She has a routine annual screening mammogram scheduled for next year.  She smokes  about ten cigarettes a day and is considering using a nicotine patch to quit. She is concerned about the cost and potential psychiatric effects of the patch, but it contains only nicotine.  She has corneal dystrophy, which is stable, and her eye doctor has not recommended any procedures. Her sister also has this condition, suggesting a genetic component.  She had a colonoscopy in 2010, which revealed a non-bleeding AVM in the cecum and internal hemorrhoids. She completed a Cologuard test three years ago and is due for another colonoscopy.  She has Hashimoto's thyroiditis with multiple small thyroid  nodules. She is monitored for thyroid  function regularly.  She experiences dry mouth, particularly at night, and suspects it may be related to her medication. She does not use a fan at night and has not tried biotin products yet.  She is active, engaging in gardening and yard work, but does not take long walks. She is up to date with her eye and dental exams and has a lung screening scheduled for December 3rd.   Advanced Directives Patient does not have advanced directives    DEPRESSION SCREENING    01/19/2024    2:08 PM 10/30/2023    8:54 AM 01/15/2023    1:07 PM 07/29/2022   10:05 AM 07/18/2022    9:10 AM 01/14/2022   10:18 AM 01/09/2021   11:47 AM  PHQ 2/9 Scores  PHQ - 2 Score 0 0 0 1 2 0 0  PHQ- 9 Score 0  1   4  5         Data saved with a previous flowsheet row definition  ROS: Negative unless specifically indicated above in HPI.    Current Outpatient Medications:    amlodipine -benazepril  (LOTREL) 2.5-10 MG capsule, TAKE 1 CAPSULE BY MOUTH DAILY., Disp: 90 capsule, Rfl: 3   Cholecalciferol (VITAMIN D3) 50 MCG (2000 UT) capsule, Take 2,000 Units by mouth daily., Disp: , Rfl:    Coenzyme Q10 (COQ10 PO), Take 1 capsule by mouth daily., Disp: , Rfl:    nicotine (NICODERM CQ - DOSED IN MG/24 HOURS) 21 mg/24hr patch, Place 1 patch (21 mg total) onto the skin daily., Disp: 28 patch,  Rfl: 0   Omega 3 1200 MG CAPS, Take 1 capsule by mouth daily., Disp: , Rfl:    rosuvastatin  (CRESTOR ) 10 MG tablet, TAKE 1 TABLET (10 MG TOTAL) BY MOUTH DAILY., Disp: 90 tablet, Rfl: 3    Objective:    BP 138/82 (BP Location: Left Arm, Patient Position: Sitting, Cuff Size: Normal)   Pulse 68   Temp 98 F (36.7 C) (Temporal)   Ht 5' 10 (1.778 m)   Wt 145 lb (65.8 kg)   SpO2 98%   BMI 20.81 kg/m   BP Readings from Last 3 Encounters:  02/02/24 138/82  01/19/24 120/82  10/30/23 120/82      Physical Exam Constitutional:      General: She is not in acute distress.    Appearance: Normal appearance. She is normal weight. She is not ill-appearing.  HENT:     Head: Normocephalic.     Right Ear: Tympanic membrane normal.     Left Ear: Tympanic membrane normal.     Nose: Nose normal.     Mouth/Throat:     Mouth: Mucous membranes are moist.  Eyes:     Extraocular Movements: Extraocular movements intact.     Pupils: Pupils are equal, round, and reactive to light.  Cardiovascular:     Rate and Rhythm: Normal rate and regular rhythm.  Pulmonary:     Effort: Pulmonary effort is normal.     Breath sounds: Normal breath sounds.  Abdominal:     General: Abdomen is flat. Bowel sounds are normal.     Palpations: Abdomen is soft.     Tenderness: There is no guarding or rebound.  Musculoskeletal:        General: Normal range of motion.     Cervical back: Normal range of motion.  Skin:    General: Skin is warm.     Capillary Refill: Capillary refill takes less than 2 seconds.  Neurological:     General: No focal deficit present.     Mental Status: She is alert.  Psychiatric:        Mood and Affect: Mood normal.        Behavior: Behavior normal.        Thought Content: Thought content normal.        Judgment: Judgment normal.       Results RADIOLOGY Routine annual screening mammogram: Negative  DIAGNOSTIC Colonoscopy: Non-bleeding arteriovenous malformation in the cecum  (2019) Cologuard: Negative (2022)  PATHOLOGY Ultrasound-guided biopsy: No sign of malignancy      Assessment & Plan:   Assessment and Plan Assessment & Plan Adult Wellness Visit Routine adult wellness visit with discussion on general health maintenance, including screenings and lifestyle modifications. - Continue routine health maintenance and screenings Patient Counseling(The following topics were reviewed):  Preventative care handout given to pt  Health maintenance and immunizations reviewed. Please refer to Health maintenance section. Pt advised on safe sex, wearing seatbelts in  car, and proper nutrition labwork ordered today for annual Dental health: Discussed importance of regular tooth brushing, flossing, and dental visits.   Tobacco use disorder Continues to smoke 10 or more cigarettes per day. Discussed nicotine patch as a cessation aid, emphasizing it contains only nicotine without psychiatric medications. Explained gradual dose reduction process and potential insurance coverage. - Prescribed 21 mg nicotine patch - Advised setting a quit date and starting the patch on that date - Recommended reading 'The Easy Way to Quit Smoking' by Dale Myron - Discussed alternative cessation aids like necklaces for oral fixation  Essential hypertension Blood pressure management with amlodipine  and benazepril . Discussed potential side effects of amlodipine , including dry mouth, and advised on timing of medication administration to avoid morning hypotension. - Continue current antihypertensive regimen - Advised on gradual transition of medication timing from evening to morning  Mixed hyperlipidemia Continues on cholesterol medication. Discussed potential side effects and importance of adherence to medication regimen. - Continue current cholesterol medication  Vitamin D  deficiency Currently taking vitamin D3. Discussed potential benefits of pairing with vitamin K2 to direct calcium  to  bones and prevent arterial calcification. Advised on maintaining vitamin D  levels between 40-60 ng/mL to avoid adverse effects. 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 Discussed autoimmune nature and management similar to hypothyroidism. No need for repeat ultrasound as nodules are small and stable. 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 Discussed potential for bleeding and importance of monitoring. Due for repeat colonoscopy as last one was in 2010. - Referred for colonoscopy at [facility] in [place]  Corneal dystrophy, stable Corneal dystrophy is well-managed with no need for intervention. Discussed genetic nature and lack of association with smoking. - Continue regular ophthalmology follow-ups  Sleep maintenance insomnia Difficulty maintaining sleep, possibly related to nocturia. Discussed potential impact of medication timing and fluid intake on sleep quality. - Advised reducing fluid intake after 6-7 PM - Will consider further evaluation if sleep issues persist  Xerostomia (dry mouth) Experiencing dry mouth, possibly related to amlodipine  use. Discussed potential causes and management strategies, including biotin and mouth tape. - Try biotin mouthwash and toothpaste - Consider using mouth tape to keep mouth closed during sleep - Monitor for improvement and adjust treatment as needed   Smoking cessation instruction/counseling given:  counseled patient on the dangers of tobacco use, advised patient to stop smoking, and reviewed strategies to maximize success       Follow-up: Return in about 5 weeks (around 03/08/2024) for f/u smoking cessation .   Ginger Patrick, FNP

## 2024-02-02 NOTE — Patient Instructions (Signed)
  A referral was placed today for [facility] for a colonoscopy  Please let us  know if you have not heard back within 2 weeks about the referral.

## 2024-02-03 ENCOUNTER — Ambulatory Visit: Payer: Self-pay | Admitting: Family

## 2024-02-03 LAB — HEMOGLOBIN A1C: Hgb A1c MFr Bld: 5.8 % (ref 4.6–6.5)

## 2024-02-18 ENCOUNTER — Other Ambulatory Visit

## 2024-03-08 ENCOUNTER — Other Ambulatory Visit: Payer: Self-pay | Admitting: Family

## 2024-03-08 DIAGNOSIS — I10 Essential (primary) hypertension: Secondary | ICD-10-CM

## 2024-03-08 DIAGNOSIS — E785 Hyperlipidemia, unspecified: Secondary | ICD-10-CM
